# Patient Record
Sex: Female | Born: 1965 | Race: White | Hispanic: No | Marital: Single | Smoking: Former smoker
Health system: Southern US, Community
[De-identification: ages and names within clinical notes are randomized; demographics above are authoritative.]

## PROBLEM LIST (undated history)

## (undated) DIAGNOSIS — I10 Essential (primary) hypertension: Secondary | ICD-10-CM

## (undated) HISTORY — PX: TOE SURGERY: SHX1073

## (undated) HISTORY — PX: WISDOM TOOTH EXTRACTION: SHX21

---

## 2013-02-02 ENCOUNTER — Emergency Department (INDEPENDENT_AMBULATORY_CARE_PROVIDER_SITE_OTHER): Payer: BC Managed Care – HMO

## 2013-02-02 ENCOUNTER — Encounter: Payer: Self-pay | Admitting: Emergency Medicine

## 2013-02-02 ENCOUNTER — Emergency Department (INDEPENDENT_AMBULATORY_CARE_PROVIDER_SITE_OTHER)
Admission: EM | Admit: 2013-02-02 | Discharge: 2013-02-02 | Disposition: A | Discharge status: Home / Self Care | Payer: BC Managed Care – HMO | Source: Home / Self Care | Attending: Family Medicine | Admitting: Family Medicine

## 2013-02-02 DIAGNOSIS — M47817 Spondylosis without myelopathy or radiculopathy, lumbosacral region: Secondary | ICD-10-CM

## 2013-02-02 DIAGNOSIS — M545 Low back pain, unspecified: Secondary | ICD-10-CM

## 2013-02-02 MED ORDER — MELOXICAM 15 MG PO TABS: 15.0000 mg | ORAL_TABLET | Freq: Every day | ORAL | Status: DC

## 2013-02-02 MED ORDER — METAXALONE 800 MG PO TABS: 800.0000 mg | ORAL_TABLET | Freq: Three times a day (TID) | ORAL | Status: DC

## 2013-02-02 NOTE — ED Notes (Signed)
Tracy [name] complains of lower right side back pain for 1 day. She reports falling on and hitting her lower back on Wednesday. The pain is worse when sitting and is a 6/10. She states the pain is a aching pain that does at times radiate down her right leg. When she is standing she notices numbness in her left leg.

## 2013-02-02 NOTE — ED Provider Notes (Signed)
CSN: [id number]     Arrival date & time 02/02/13  1256 History   First MD Initiated Contact with Patient 02/02/13 1346     Chief Complaint  Patient presents with  . Back Pain    lower back pain for 1 day      HPI Comments: Patient reports that she slipped on ice steps four days ago, landing on her right buttock.  She did not have much discomfort initially, but yesterday she developed increased right lower back pain when sitting, and now has intermittent radiation into her right, with occasional brief decreased sensation over the anterior aspect of her right thigh.  Her pain decreases when she stands and supine in bed.  No bowel or bladder dysfunction.  No saddle numbness.  Patient is a 48 y.o. female presenting with back pain. The history is provided by the patient.  Back Pain Location:  Lumbar spine Quality:  Aching Pain severity:  Moderate Pain is:  Worse during the day Onset quality:  Gradual Duration:  1 day Timing:  Intermittent Progression:  Worsening Chronicity:  New Relieved by: standing. Worsened by:  Sitting Ineffective treatments:  Heating pad (naproxen and TENS unit) Associated symptoms: numbness and paresthesias   Associated symptoms: no abdominal pain, no abdominal swelling, no bladder incontinence, no bowel incontinence, no fever, no leg pain, no pelvic pain, no perianal numbness, no tingling and no weakness   Risk factors: obesity     History reviewed. No pertinent past medical history. Past Surgical History  Procedure Laterality Date  . Wisdom tooth extraction    . Toe surgery     Family History  Problem Relation Age of Onset  . Diabetes Mother   . Hyperlipidemia Mother   . Cancer Father     lung   History  Substance Use Topics  . Smoking status: Former Games developer  . Smokeless tobacco: Never Used  . Alcohol Use: No   OB History   Grav Para Term Preterm Abortions TAB SAB Ect Mult Living                 Review of Systems  Constitutional: Negative for  fever.  Gastrointestinal: Negative for abdominal pain and bowel incontinence.  Genitourinary: Negative for bladder incontinence and pelvic pain.  Musculoskeletal: Positive for back pain.  Neurological: Positive for numbness and paresthesias. Negative for tingling and weakness.  All other systems reviewed and are negative.    Allergies  Penicillins  Home Medications   Current Outpatient Rx  Name  Route  Sig  Dispense  Refill  . naproxen (NAPROSYN) 500 MG tablet   Oral   Take 500 mg by mouth 2 (two) times daily with a meal.         . meloxicam (MOBIC) 15 MG tablet   Oral   Take 1 tablet (15 mg total) by mouth daily. Take with food each morning   15 tablet   0   . metaxalone (SKELAXIN) 800 MG tablet   Oral   Take 1 tablet (800 mg total) by mouth 3 (three) times daily. May take one tab, one to three times daily   21 tablet   0    BP 143/81  Pulse 86  Temp(Src) 98.7 F (37.1 C) (Oral)  Ht 5\' 7"  (1.702 m)  Wt 283 lb (128.368 kg)  BMI 44.31 kg/m2  SpO2 96%  LMP 01/23/2013 Physical Exam  Nursing note and vitals reviewed. Constitutional: She is oriented to person, place, and time. She appears well-developed  and well-nourished. No distress.  Patient is obese (BMI 44.3)  HENT:  Head: Atraumatic.  Eyes: Conjunctivae are normal. Pupils are equal, round, and reactive to light.  Neck: Normal range of motion.  Cardiovascular: Normal heart sounds.   Pulmonary/Chest: Breath sounds normal.  Abdominal: There is no tenderness.  Musculoskeletal: Normal range of motion.       Lumbar back: She exhibits tenderness, pain and spasm. She exhibits no bony tenderness, no swelling, no edema, no deformity and normal pulse.       Back:  Back has decreased range of motion, although she can heel/toe walk and squat without difficulty.    Tenderness in the right sacral area.  Straight leg raising test is negative.  Sitting knee extension test is negative.  Strength and sensation in the lower  extremities is normal.  Patellar reflexes are present with augmentation.  Achilles reflexes are normal   Neurological: She is alert and oriented to person, place, and time.  Skin: Skin is warm and dry. No rash noted.    ED Course  Procedures  None  Imaging Review Dg Lumbar Spine Complete  02/02/2013   CLINICAL DATA:  Low back pain status post fall 4 days ago.  EXAM: LUMBAR SPINE - COMPLETE 4+ VIEW  COMPARISON:  None  FINDINGS: The lumbar vertebral bodies are preserved in height. There is mild disc space narrowing at L3-4. There is small anterior endplate spurs at L2, L3, L4, and L5. There is no pars defect. There is no spondylolisthesis. The pedicles and transverse processes are grossly normal. The observed portions of the sacrum are normal.  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 There is no evidence of acute acute compression fracture nor other acute bony abnormality.   Electronically Signed   By: David  [place]   On: 02/02/2013 14:45      MDM   1. Acute low back pain    Begin Mobic and Skelaxin. Apply ice pack two to three times daily.  Begin back range of motion and stretching exercises in about 3 days ([facility] information and instruction handout given)  Take Naproxen today; begin Mobic tomorrow morning. Followup with Dr. Rodney Langtonhomas Thekkekand[facility] ([facility]) if not improving about two weeks.     Lattie HawStephen A Maziah Keeling, MD 02/05/13 [phone]

## 2013-02-02 NOTE — Discharge Instructions (Signed)
Apply ice pack two to three times daily.  Begin back range of motion and stretching exercises in about 3 days. Take Naproxen today; begin Mobic tomorrow morning.  Back Pain, Adult Low back pain is very common. About 1 in 5 people have back pain.The cause of low back pain is rarely dangerous. The pain often gets better over time.About half of people with a sudden onset of back pain feel better in just 2 weeks. About 8 in 10 people feel better by 6 weeks.  CAUSES Some common causes of back pain include:  Strain of the muscles or ligaments supporting the spine.  Wear and tear (degeneration) of the spinal discs.  Arthritis.  Direct injury to the back. DIAGNOSIS Most of the time, the direct cause of low back pain is not known.However, back pain can be treated effectively even when the exact cause of the pain is unknown.Answering your caregiver's questions about your overall health and symptoms is one of the most accurate ways to make sure the cause of your pain is not dangerous. If your caregiver needs more information, he or she may order lab work or imaging tests (X-rays or MRIs).However, even if imaging tests show changes in your back, this usually does not require surgery. HOME CARE INSTRUCTIONS For many people, back pain returns.Since low back pain is rarely dangerous, it is often a condition that people can learn to [facility] their own.   Remain active. It is stressful on the back to sit or stand in one place. Do not sit, drive, or stand in one place for more than 30 minutes at a time. Take short walks on level surfaces as soon as pain allows.Try to increase the length of time you walk each day.  Do not stay in bed.Resting more than 1 or 2 days can delay your recovery.  Do not avoid exercise or work.Your body is made to move.It is not dangerous to be active, even though your back may hurt.Your back will likely heal faster if you return to being active before your pain is  gone.  Pay attention to your body when you bend and lift. Many people have less discomfortwhen lifting if they bend their knees, keep the load close to their bodies,and avoid twisting. Often, the most comfortable positions are those that put less stress on your recovering back.  Find a comfortable position to sleep. Use a firm mattress and lie on your side with your knees slightly bent. If you lie on your back, put a pillow under your knees.  Only take over-the-counter or prescription medicines as directed by your caregiver. Over-the-counter medicines to reduce pain and inflammation are often the most helpful.Your caregiver may prescribe muscle relaxant drugs.These medicines help dull your pain so you can more quickly return to your normal activities and healthy exercise.  Put ice on the injured area.  Put ice in a plastic bag.  Place a towel between your skin and the bag.  Leave the ice on for 15-20 minutes, 03-04 times a day for the first 2 to 3 days. After that, ice and heat may be alternated to reduce pain and spasms.  Ask your caregiver about trying back exercises and gentle massage. This may be of some benefit.  Avoid feeling anxious or stressed.Stress increases muscle tension and can worsen back pain.It is important to recognize when you are anxious or stressed and learn ways to manage it.Exercise is a great option. SEEK MEDICAL CARE IF:  You have pain that is not  relieved with rest or medicine.  You have pain that does not improve in 1 week.  You have new symptoms.  You are generally not feeling well. SEEK IMMEDIATE MEDICAL CARE IF:   You have pain that radiates from your back into your legs.  You develop new bowel or bladder control problems.  You have unusual weakness or numbness in your arms or legs.  You develop nausea or vomiting.  You develop abdominal pain.  You feel faint. Document Released: 01/02/2005 Document Revised: 07/04/2011 Document Reviewed:  05/23/2010 [facility] Patient Information 2014 [place], [place].

## 2013-02-06 ENCOUNTER — Telehealth: Payer: Self-pay | Admitting: Emergency Medicine

## 2014-06-27 ENCOUNTER — Encounter: Payer: Self-pay | Admitting: Emergency Medicine

## 2014-06-27 ENCOUNTER — Emergency Department (INDEPENDENT_AMBULATORY_CARE_PROVIDER_SITE_OTHER)
Admission: EM | Admit: 2014-06-27 | Discharge: 2014-06-27 | Disposition: A | Discharge status: Home / Self Care | Payer: Self-pay | Source: Home / Self Care | Attending: Family Medicine | Admitting: Family Medicine

## 2014-06-27 DIAGNOSIS — L089 Local infection of the skin and subcutaneous tissue, unspecified: Secondary | ICD-10-CM

## 2014-06-27 DIAGNOSIS — S20469A Insect bite (nonvenomous) of unspecified back wall of thorax, initial encounter: Secondary | ICD-10-CM

## 2014-06-27 DIAGNOSIS — W57XXXA Bitten or stung by nonvenomous insect and other nonvenomous arthropods, initial encounter: Principal | ICD-10-CM

## 2014-06-27 MED ORDER — MUPIROCIN 2 % EX OINT: 1.0000 "application " | TOPICAL_OINTMENT | Freq: Three times a day (TID) | CUTANEOUS | Status: DC

## 2014-06-27 NOTE — ED Provider Notes (Signed)
CSN: [id number]     Arrival date & time 06/27/14  1159 History   First MD Initiated Contact with Patient 06/27/14 1215     Chief Complaint  Patient presents with  . Insect Bite      HPI Comments: Patient discovered an embedded tick on her back 3.5 weeks ago (she believes that the tick had been embedded for about a week).  She presented to an ER where the tick was removed, and she was treated with doxycycline  BID for 2 weeks. She complains of persistent itching at the bite site and is concerned that the area may be infected.  She feels well otherwise.  No fevers, chills, and sweats.  No rash.  The history is provided by the patient.    History reviewed. No pertinent past medical history. Past Surgical History  Procedure Laterality Date  . Wisdom tooth extraction    . Toe surgery     Family History  Problem Relation Age of Onset  . Diabetes Mother   . Hyperlipidemia Mother   . Cancer Father     lung   History  Substance Use Topics  . Smoking status: Former Games developer  . Smokeless tobacco: Never Used  . Alcohol Use: No   OB History    No data available     Review of Systems  Constitutional: Negative for fever, chills, diaphoresis and fatigue.  HENT: Negative.   Eyes: Negative.   Respiratory: Negative.   Cardiovascular: Negative.   Gastrointestinal: Negative.   Genitourinary: Negative.   Musculoskeletal: Negative.   Skin: Positive for wound.  Neurological: Negative for headaches.    Allergies  Penicillins  Home Medications   Prior to Admission medications   Medication Sig Start Date End Date Taking? Authorizing Provider  meloxicam (MOBIC) 15 MG tablet Take 1 tablet (15 mg total) by mouth daily. Take with food each morning 02/02/13   Lattie Haw, MD  metaxalone (SKELAXIN) 800 MG tablet Take 1 tablet (800 mg total) by mouth 3 (three) times daily. May take one tab, one to three times daily 02/02/13   Lattie Haw, MD  mupirocin ointment (BACTROBAN) 2 % Apply  1 application topically 3 (three) times daily. 06/27/14   Lattie Haw, MD  naproxen (NAPROSYN) 500 MG tablet Take 500 mg by mouth 2 (two) times daily with a meal.    Historical Provider, MD   BP 134/83 mmHg  Pulse 79  Temp(Src) 98.7 F (37.1 C) (Oral)  Ht  (1.702 m)  Wt 266 lb 4 oz (120.77 kg)  BMI 41.69 kg/m2 Physical Exam  Constitutional: She is oriented to person, place, and time. She appears well-developed and well-nourished. No distress.  Patient is obese (BMI 41.7)  HENT:  Head: Normocephalic.  Nose: Nose normal.  Mouth/Throat: Oropharynx is clear and moist.  Eyes: Conjunctivae are normal. Pupils are equal, round, and reactive to light.  Neck: Neck supple.  Cardiovascular: Normal heart sounds.   Pulmonary/Chest: Breath sounds normal.    On patient's mid-back there is a 1cm diameter erythematous raised area of inflammation with small central 3mm diameter crater as noted on diagram.  No fluctuance.  No surrounding tenderness to palpation.    Abdominal: There is no tenderness.  Musculoskeletal: She exhibits no edema.  Lymphadenopathy:    She has no cervical adenopathy.  Neurological: She is alert and oriented to person, place, and time.  Skin: Skin is warm and dry.  Nursing note and vitals reviewed.   ED Course  Procedures none   Labs Reviewed  WOUND CULTURE      MDM   1. Infected tick bite of upper back excluding scapular region, unspecified laterality, initial encounter    Wound culture pending. Begin Bactroban ointment. Keep wound bandaged until healed, and change bandage daily. Followup with dermatologist if not resolved one week.    Lattie Haw, MD 07/04/14 1102

## 2014-06-27 NOTE — Discharge Instructions (Signed)
Keep wound bandaged until healed, and change bandage daily.

## 2014-06-27 NOTE — ED Notes (Signed)
Patient presents to the [facility] after having a tick removed 3.5 week ago. She took a round of antibiotics and is concerned that the bite could be infected.

## 2014-06-29 LAB — WOUND CULTURE
GRAM STAIN: NONE SEEN
GRAM STAIN: NONE SEEN
Gram Stain: NONE SEEN
ORGANISM ID, BACTERIA: NO GROWTH

## 2014-07-06 ENCOUNTER — Telehealth: Payer: Self-pay | Admitting: *Deleted

## 2015-01-22 ENCOUNTER — Ambulatory Visit (INDEPENDENT_AMBULATORY_CARE_PROVIDER_SITE_OTHER): Payer: BLUE CROSS/BLUE SHIELD

## 2015-01-22 ENCOUNTER — Ambulatory Visit (INDEPENDENT_AMBULATORY_CARE_PROVIDER_SITE_OTHER): Payer: BLUE CROSS/BLUE SHIELD | Admitting: Sports Medicine

## 2015-01-22 ENCOUNTER — Encounter: Payer: Self-pay | Admitting: Sports Medicine

## 2015-01-22 VITALS — BP 155/85 | HR 84 | Resp 16 | Wt 272.3 lb

## 2015-01-22 DIAGNOSIS — M25512 Pain in left shoulder: Secondary | ICD-10-CM

## 2015-01-22 DIAGNOSIS — M50323 Other cervical disc degeneration at C6-C7 level: Secondary | ICD-10-CM

## 2015-01-22 DIAGNOSIS — M5412 Radiculopathy, cervical region: Secondary | ICD-10-CM | POA: Insufficient documentation

## 2015-01-22 MED ORDER — MELOXICAM 15 MG PO TABS: ORAL_TABLET | ORAL | Status: DC

## 2015-01-22 NOTE — Progress Notes (Addendum)
Subjective:    I'm seeing this patient as a consultation for:  Tracy Minks, NP  CC: left shoulder pain  HPI: This is a pleasant 50 year old female with a several month history of pain that she localizes over the anterolateral and posterior lateral aspect of her left shoulder, with radiation from the neck down to the elbow. Pain is worse with overhead activities and reaching behind her back, no trauma. She does have some neck pain, and also has some pain over the right forearm laterally. Hasn't taken any medications, and has never been treated for this before. Pain is moderate, persistent.  On further questioning she does also have moderate difficulty  Falling asleep, mild anhedonia, mild depressed mood, mild poor energy and mild overeating. No suicidal or homicidal ideation.  Past medical history, Surgical history, Family history not pertinant except as noted below, Social history, Allergies, and medications have been entered into the medical record, reviewed, and no changes needed.   Review of Systems: No headache, visual changes, nausea, vomiting, diarrhea, constipation, dizziness, abdominal pain, skin rash, fevers, chills, night sweats, weight loss, swollen lymph nodes, body aches, joint swelling, muscle aches, chest pain, shortness of breath, mood changes, visual or auditory hallucinations.   Objective:   General: Well Developed, well nourished, and in no acute distress.  Neuro/Psych: Alert and oriented x3, extra-ocular muscles intact, able to move all 4 extremities, sensation grossly intact. Skin: Warm and dry, no rashes noted.  Respiratory: Not using accessory muscles, speaking in full sentences, trachea midline.  Cardiovascular: Pulses palpable, no extremity edema. Abdomen: Does not appear distended. Left Shoulder: Inspection reveals no abnormalities, atrophy or asymmetry. Palpation is normal with no tenderness over AC joint or bicipital groove. ROM is full in all  planes. Rotator cuff strength normal throughout. Positive Neer and Hawkin's tests, empty can. Speeds and Yergason's tests normal. No labral pathology noted with negative Obrien's, negative crank, negative clunk, and good stability. Normal scapular function observed. No painful arc and no drop arm sign. No apprehension sign  Procedure: Real-time Ultrasound Guided Injection of left subacromial bursa Device: GE Logiq E  Verbal informed consent obtained.  Time-out conducted.  Noted no overlying erythema, induration, or other signs of local infection.  Skin prepped in a sterile fashion.  Local anesthesia: Topical Ethyl chloride.  With sterile technique and under real time ultrasound guidance:  25-gauge needle advanced into the bursa, 1 mL kenalog 40, 3 mL lidocaine injected easily. Completed without difficulty  Pain immediately resolved suggesting accurate placement of the medication.  Advised to call if fevers/chills, erythema, induration, drainage, or persistent bleeding.  Images permanently stored and available for review in the ultrasound unit.  Impression: Technically successful ultrasound guided injection.  Procedure: Real-time Ultrasound Guided Injection of left glenohumeral joint Device: GE Logiq E  Verbal informed consent obtained.  Time-out conducted.  Noted no overlying erythema, induration, or other signs of local infection.  Skin prepped in a sterile fashion.  Local anesthesia: Topical Ethyl chloride.  With sterile technique and under real time ultrasound guidance:  Spinal needle advanced into the glenohumeral joint taking care to avoid the labrum, 1 mL kenalog 40, 3 mL lidocaine injected easily. Completed without difficulty  Pain immediately resolved suggesting accurate placement of the medication.  Advised to call if fevers/chills, erythema, induration, drainage, or persistent bleeding.  Images permanently stored and available for review in the ultrasound unit.   Impression: Technically successful ultrasound guided injection.  Impression and Recommendations:   This case required medical decision  making of moderate complexity.

## 2015-01-22 NOTE — Assessment & Plan Note (Signed)
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 She does have significant impingement type symptoms referable to the supraspinatus and subscapularis. Pain is waking her from sleep. Subacromial and glenohumeral injections under ultrasound guidance, x-rays of the neck and shoulder, as well as formal physical therapy for the neck and shoulder, return to see me in one.

## 2015-01-29 ENCOUNTER — Ambulatory Visit (INDEPENDENT_AMBULATORY_CARE_PROVIDER_SITE_OTHER): Payer: BLUE CROSS/BLUE SHIELD | Admitting: Rehabilitative and Restorative Service Providers"

## 2015-01-29 ENCOUNTER — Encounter: Payer: Self-pay | Admitting: Rehabilitative and Restorative Service Providers"

## 2015-01-29 DIAGNOSIS — M629 Disorder of muscle, unspecified: Secondary | ICD-10-CM

## 2015-01-29 DIAGNOSIS — M623 Immobility syndrome (paraplegic): Secondary | ICD-10-CM | POA: Diagnosis not present

## 2015-01-29 DIAGNOSIS — M25512 Pain in left shoulder: Secondary | ICD-10-CM

## 2015-01-29 DIAGNOSIS — Z7409 Other reduced mobility: Secondary | ICD-10-CM

## 2015-01-29 DIAGNOSIS — R293 Abnormal posture: Secondary | ICD-10-CM

## 2015-01-29 DIAGNOSIS — M2560 Stiffness of unspecified joint, not elsewhere classified: Secondary | ICD-10-CM

## 2015-01-29 DIAGNOSIS — M6289 Other specified disorders of muscle: Secondary | ICD-10-CM

## 2015-01-29 NOTE — Therapy (Signed)
[facility] [place] [address] [place], [place], [address] Phone: [phone]   Fax:  [phone]  Physical Therapy Evaluation  Patient Details  Name: Tracy [name] MRN: [id number] Date of Birth: 08-13-1965 Referring Provider: DR T   Encounter Date: 01/29/2015      PT End of Session - 01/29/15 1451    Visit Number 1   Number of Visits 12   Date for PT Re-Evaluation 03/12/15   PT Start Time 1401   PT Stop Time 1459   PT Time Calculation (min) 58 min   Activity Tolerance Patient tolerated treatment well      History reviewed. No pertinent past medical history.  Past Surgical History  Procedure Laterality Date  . Wisdom tooth extraction    . Toe surgery      There were no vitals filed for this visit.  Visit Diagnosis:  Pain in shoulder region, left - Plan: PT plan of care cert/re-cert  Abnormal posture - Plan: PT plan of care cert/re-cert  Muscular imbalance - Plan: PT plan of care cert/re-cert  Stiffness due to immobility - Plan: PT plan of care cert/re-cert      Subjective Assessment - 01/29/15 1405    Subjective Patient reports that she started having pain in the Lt shoulder, neck and arm ~ 3-4 months ago. Symptoms continued and she began having difficulty sleeping and using Lt UE. She was seen by MD last week and diagnosed with impingement. She received 2 injections last week which has helped some. She continues to have discomfort and pain which she feels is muscular.    Pertinent History problems with neck and shoulder pain in the past with muscular tightness effecting Lt arm ~ 15 years ago which resolved.    How long can you sit comfortably? no limit   How long can you stand comfortably? no limit   How long can you walk comfortably? no limit   Diagnostic tests xrays - showed impingement; bursitis DDD cervical spine   Patient Stated Goals get rid of pain and prevent the pain from coming back   Currently in  Pain? Yes   Pain Score 3    Pain Location Shoulder   Pain Orientation Left;Anterior;Upper   Pain Descriptors / Indicators Dull;Aching  sharp wit htwisting arm    Pain Radiating Towards up into the Lt neck area and down into the Lt bidep   Pain Onset More than a month ago   Pain Frequency Constant   Aggravating Factors  twisting arm; fastendin or unfastending bra; dressing; lying on Rt side pulling cover up at night; lifting above a certain point; difficulty sleeping    Pain Relieving Factors injection some; heat at times             [facility] PT Assessment - 01/29/15 0001    Assessment   Medical Diagnosis Lt shoudler pain    Referring Provider DR T    Onset Date/Surgical Date 10/01/14   Hand Dominance Left   Next MD Visit 02/19/15   Prior Therapy none   Precautions   Precautions None   Balance Screen   Has the patient fallen in the past 6 months No   Has the patient had a decrease in activity level because of a fear of falling?  No   Is the patient reluctant to leave their home because of a fear of falling?  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 up and down during the day - primarily at computer changed jobs 8/16 with a new desk   Leisure household chores; working out with a Psychologist, educational 3x/wk - aerobics/weights free [employer] and machines    Observation/Other Assessments   Focus on Therapeutic Outcomes (FOTO)  35% limitation    Sensation   Additional Comments WFL's per pt report    Posture/Postural Control   Posture Comments head forward; shoudlers rounded and elevated; head of the humerus anterior in orientation; increased thoracic kyphosis; scapulae abducted and rotated along the thoracic wall; head of the humer anterior in orientation    AROM   Right Shoulder Extension 55 Degrees   Right Shoulder Flexion 153 Degrees   Right Shoulder ABduction 157 Degrees    Right Shoulder Internal Rotation 35 Degrees   Right Shoulder External Rotation 90 Degrees   Left Shoulder Extension 52 Degrees   Left Shoulder Flexion 143 Degrees  pain   Left Shoulder ABduction 144 Degrees  pain   Left Shoulder Internal Rotation 30 Degrees  pain    Left Shoulder External Rotation 78 Degrees  pain    Cervical Flexion 62   Cervical Extension 46   Cervical - Right Side Bend 36  tight Rt > Lt   Cervical - Left Side Bend 35  tight   Cervical - Right Rotation 78   Cervical - Left Rotation 55   Strength   Overall Strength Comments 5/5 pain with resisted ER/IR Lt    Palpation   Palpation comment pain and tightness Lt pecs/anterior shoulder/upper trap/ant-lat-post cervical musculature                   [facility] Adult PT Treatment/Exercise - 01/29/15 0001    Therapeutic Activites    Therapeutic Activities --  myofacial ball release work    Neuro Re-ed    Neuro Re-ed Details  postural correction working to engage posterior shoulder girdle myusculature    Shoulder Exercises: Standing   Other Standing Exercises axial extension 10 sec x 5    Other Standing Exercises scap squeeze with noodle 10 sec x 10    Shoulder Exercises: Lawyer Limitations doorway 30 sec x 3 positions 2 reps each    Moist Heat Therapy   Number Minutes Moist Heat 15 Minutes   Moist Heat Location Cervical;Shoulder   Electrical Stimulation   Electrical Stimulation Location Lt posterior cervical; ant/post shd; biceps   Electrical Stimulation Action IFC   Electrical Stimulation Parameters to tolerance   Electrical Stimulation Goals Pain;Tone                PT Education - 01/29/15 1446    Education provided Yes   Education Details posture and alignment; myofacial ball release work; Leisure centre manager) Educated Patient   Methods Explanation;Demonstration;Tactile cues;Verbal cues;Handout   Comprehension Verbalized understanding;Returned demonstration;Verbal cues  required;Tactile cues required             PT Long Term Goals - 01/29/15 1501    PT LONG TERM GOAL #1   Title Improve posture and alignment with patient to demonstrate good alignment with scapulae in good position along the thoracic wall and shoulder over hips and knees 03/12/15   Time 6   Period Weeks   Status New   PT LONG TERM GOAL #2   Title Pain free resistive strength Lt shoulder 03/12/15   Time  6   Period Weeks   Status New   PT LONG TERM GOAL #3   Title Decrease pain Lt shouder allowing patient to dress and undress - including bra - without pain 03/12/15   Time 6   Period Weeks   Status New   PT LONG TERM GOAL #4   Title I in HEP 03/12/15   Time 6   Period Weeks   Status New   PT LONG TERM GOAL #5   Title Improve FOTO to </= 28% limitation 03/12/15   Time 6   Period Weeks   Status New               Plan - 01/29/15 1451    Clinical Impression Statement Patient presents with Lt shoulder and upper quarter dysfunction including pain; limited cervical and Lt shoulder ROM/mobility; pain with resistive testing Lt shoulder; pain with palpation; abnormal posture and alignment; muscular imbalance. She will benefit from PT to address problems identified.    Pt will benefit from skilled therapeutic intervention in order to improve on the following deficits Postural dysfunction;Improper body mechanics;Decreased range of motion;Decreased mobility;Increased fascial restricitons;Decreased endurance;Decreased activity tolerance;Pain   Rehab Potential Good   PT Frequency 2x / week   PT Duration 6 weeks   PT Treatment/Interventions Patient/family education;ADLs/Self Care Home Management;Manual techniques;Dry needling;Therapeutic exercise;Therapeutic activities;Neuromuscular re-education;Ultrasound;Cryotherapy;Electrical Stimulation;Moist Heat   PT Next Visit Plan continue with postural correction; stretching; add manual work for Lt upper quarter; progress with posterior shoulder  girdle strengthening and improving muscular balance   PT Home Exercise Plan postural correction; HEP; myofacial ball release    Consulted and Agree with Plan of Care Patient         Problem List Patient Active Problem List   Diagnosis Date Noted  . Left shoulder pain 01/22/2015    Glendola Friedhoff Rober MinionP Nyomie Ehrlich PT, MPH  01/29/2015, 3:17 PM  [facility] [address] [place] [place], [place], [address] Phone: [phone]   Fax:  [phone]  Name: Tracy [name] MRN: [id number] Date of Birth: 11-09-1965

## 2015-01-29 NOTE — Patient Instructions (Signed)
Axial Extension (Chin Tuck)    Pull chin in and lengthen back of neck. Hold _10___ seconds while counting out loud. Repeat __5-10__ times. Do several  sessions per day.   Shoulder Blade Squeeze    Rotate shoulders back, then squeeze shoulder blades down and back Hold 10 sec Repeat __10__ times. Do _several___ sessions per day.   Scapula Adduction With Pectoralis Stretch: Low - Standing   Shoulders at 45 hands even with shoulders, keeping weight through legs, shift weight forward until you feel pull or stretch through the front of your chest. Hold _30__ seconds. Do _3__ times, _2-4__ times per day.   Scapula Adduction With Pectoralis Stretch: Mid-Range - Standing   Shoulders at 90 elbows even with shoulders, keeping weight through legs, shift weight forward until you feel pull or strength through the front of your chest. Hold __30_ seconds. Do _3__ times, __2-4_ times per day.   Scapula Adduction With Pectoralis Stretch: High - Standing   Shoulders at 120 hands up high on the doorway, keeping weight on feet, shift weight forward until you feel pull or stretch through the front of your chest. Hold _30__ seconds. Do _3__ times, _2-3__ times per day.  Massage using 4 inch rubber ball  TENS unit for pain management

## 2015-02-02 ENCOUNTER — Ambulatory Visit (INDEPENDENT_AMBULATORY_CARE_PROVIDER_SITE_OTHER): Payer: BLUE CROSS/BLUE SHIELD | Admitting: Physical Therapy

## 2015-02-02 DIAGNOSIS — M629 Disorder of muscle, unspecified: Secondary | ICD-10-CM | POA: Diagnosis not present

## 2015-02-02 DIAGNOSIS — M25512 Pain in left shoulder: Secondary | ICD-10-CM | POA: Diagnosis not present

## 2015-02-02 DIAGNOSIS — M623 Immobility syndrome (paraplegic): Secondary | ICD-10-CM

## 2015-02-02 DIAGNOSIS — R293 Abnormal posture: Secondary | ICD-10-CM | POA: Diagnosis not present

## 2015-02-02 DIAGNOSIS — Z7409 Other reduced mobility: Secondary | ICD-10-CM

## 2015-02-02 DIAGNOSIS — M2560 Stiffness of unspecified joint, not elsewhere classified: Secondary | ICD-10-CM

## 2015-02-02 DIAGNOSIS — M6289 Other specified disorders of muscle: Secondary | ICD-10-CM

## 2015-02-02 NOTE — Therapy (Signed)
[facility] [place] [address] [place] [address] [place], [place], [address] Phone: [phone]   Fax:  [phone]  Physical Therapy Treatment  Patient Details  Name: Tracy [name] MRN: [id number] Date of Birth: January 09, 1966 Referring Provider: Dr. Briant Sites  Encounter Date: 02/02/2015      PT End of Session - 02/02/15 0804    Visit Number 2   Number of Visits 12   Date for PT Re-Evaluation 03/12/15   PT Start Time 0803   PT Stop Time 0856   PT Time Calculation (min) 53 min   Activity Tolerance Patient tolerated treatment well;No increased pain      No past medical history on file.  Past Surgical History  Procedure Laterality Date  . Wisdom tooth extraction    . Toe surgery      There were no vitals filed for this visit.  Visit Diagnosis:  Pain in shoulder region, left  Abnormal posture  Muscular imbalance  Stiffness due to immobility      Subjective Assessment - 02/02/15 0805    Subjective Pt reports she has had no new changes since initial visit. Has been performing HEP, 1x.     Currently in Pain? Yes   Pain Score 1    Pain Location Shoulder   Pain Orientation Left;Anterior;Upper   Pain Descriptors / Indicators Dull;Aching   Aggravating Factors  donning bra, sleeping on it, any "twisting arm" (IR)   Pain Relieving Factors ??            [facility] PT Assessment - 02/02/15 0001    Assessment   Medical Diagnosis Lt shoulder pain    Referring Provider Dr. Briant Sites   Onset Date/Surgical Date 10/01/14   Hand Dominance Left   Next MD Visit 02/19/15   Prior Therapy none          [facility] Adult PT Treatment/Exercise - 02/02/15 0001    Self-Care   Self-Care Other Self-Care Comments   Other Self-Care Comments  Pt instructed in self MFR to anterior chest; pt returned demo.    Exercises   Exercises Neck;Shoulder   Neck Exercises: Machines for Strengthening   UBE (Upper Arm Bike) L1: 1.5 min forward, 1.5' backward    Neck Exercises: Theraband   Scapula Retraction 10 reps  5 sec    Neck Exercises: Seated   Neck Retraction 10 reps;3 secs   Shoulder Exercises: Pulleys   Flexion 2 minutes   ABduction 2 minutes   Shoulder Exercises: Stretch   Corner Stretch Limitations doorway 30 sec x 3 positions 2 reps each; repeated unilateral with Lt.     Table Stretch - Flexion 5 reps;10 seconds   Table Stretch -Flexion Limitations demo and VC for improved form    Moist Heat Therapy   Number Minutes Moist Heat 15 Minutes   Moist Heat Location Cervical;Shoulder  Lt   Electrical Stimulation   Electrical Stimulation Location Lt shoulder / upper trap    Electrical Stimulation Action IFC   Electrical Stimulation Parameters  to tolerance, 15 min    Electrical Stimulation Goals Pain   Neck Exercises: Stretches   Upper Trapezius Stretch 2 reps;20 seconds           PT Education - 02/02/15 0844    Education provided Yes   Education Details HEP - added table flexion stretch   Person(s) Educated Patient   Methods Handout   Comprehension Verbalized understanding;Returned demonstration             PT Long  Term Goals - 02/02/15 0846    PT LONG TERM GOAL #1   Title Improve posture and alignment with patient to demonstrate good alignment with scapulae in good position along the thoracic wall and shoulder over hips and knees 03/12/15   Time 6   Period Weeks   Status On-going   PT LONG TERM GOAL #2   Title Pain free resistive strength Lt shoulder 03/12/15   Time 6   Period Weeks   Status On-going   PT LONG TERM GOAL #3   Title Decrease pain Lt shouder allowing patient to dress and undress - including bra - without pain 03/12/15   Time 6   Period Weeks   Status On-going   PT LONG TERM GOAL #4   Title I in HEP 03/12/15   Time 6   Period Weeks   Status On-going   PT LONG TERM GOAL #5   Title Improve FOTO to </= 28% limitation 03/12/15   Time 6   Period Weeks   Status On-going               Plan  - 02/02/15 0845    Clinical Impression Statement Pt required some demonstration and tactile cues for improved form of exercises.  Pt tolerated all exercises without increase in pain.  Pt reported decrease in discomfort with use of MHP and estim at end of session.  Progressing towards goals.    Pt will benefit from skilled therapeutic intervention in order to improve on the following deficits Postural dysfunction;Improper body mechanics;Decreased range of motion;Decreased mobility;Increased fascial restricitons;Decreased endurance;Decreased activity tolerance;Pain   Rehab Potential Good   PT Frequency 2x / week   PT Duration 6 weeks   PT Treatment/Interventions Patient/family education;ADLs/Self Care Home Management;Manual techniques;Dry needling;Therapeutic exercise;Therapeutic activities;Neuromuscular re-education;Ultrasound;Cryotherapy;Electrical Stimulation;Moist Heat   PT Next Visit Plan continue with postural correction; stretching; add manual work for [employer] upper quarter.    Consulted and Agree with Plan of Care Patient        Problem List Patient Active Problem List   Diagnosis Date Noted  . Left shoulder pain 01/22/2015   Mayer Camel, PTA 02/02/2015 10:11 AM  [facility] [address] [place] [address] [place], [place], [address] Phone: [phone]   Fax:  [phone]  Name: Tracy [name] MRN: [id number] Date of Birth: Apr 06, 1965

## 2015-02-02 NOTE — Patient Instructions (Signed)
Flexion (Passive)    Sitting upright, slide forearm forward along table, bending from the waist until a stretch is felt.  (Use trunk to move arm, keep arm relaxed on table) Hold __10__ seconds. Repeat _5-10___ times. Do _1-2___ sessions per day.  [facility] [address] [place], [place] [address]  [phone] (office) [phone] (fax)

## 2015-02-04 ENCOUNTER — Ambulatory Visit (INDEPENDENT_AMBULATORY_CARE_PROVIDER_SITE_OTHER): Payer: BLUE CROSS/BLUE SHIELD | Admitting: Physical Therapy

## 2015-02-04 DIAGNOSIS — M623 Immobility syndrome (paraplegic): Secondary | ICD-10-CM | POA: Diagnosis not present

## 2015-02-04 DIAGNOSIS — M2560 Stiffness of unspecified joint, not elsewhere classified: Secondary | ICD-10-CM

## 2015-02-04 DIAGNOSIS — M629 Disorder of muscle, unspecified: Secondary | ICD-10-CM

## 2015-02-04 DIAGNOSIS — R293 Abnormal posture: Secondary | ICD-10-CM

## 2015-02-04 DIAGNOSIS — M6289 Other specified disorders of muscle: Secondary | ICD-10-CM

## 2015-02-04 DIAGNOSIS — M25512 Pain in left shoulder: Secondary | ICD-10-CM | POA: Diagnosis not present

## 2015-02-04 DIAGNOSIS — Z7409 Other reduced mobility: Secondary | ICD-10-CM

## 2015-02-04 NOTE — Patient Instructions (Signed)
Over Head Pull: Narrow Grip     [place] [phone]   On back, knees bent, feet flat, band across thighs, elbows straight but relaxed. Pull hands apart (start). Keeping elbows straight, bring arms up and over head, hands toward floor. Keep pull steady on band. Hold momentarily. Return slowly, keeping pull steady, back to start. Repeat _10__ times. 1-2 sets. Band color __yellow or red____   Side Pull: Double Arm   On back, knees bent, feet flat. Arms perpendicular to body, shoulder level, elbows straight but relaxed. Pull arms out to sides, elbows straight. Resistance band comes across collarbones, hands toward floor. Hold momentarily. Slowly return to starting position. Repeat _10__ times. 1-2 sets.  Band color _red____   Elisabeth Cara   On back, knees bent, feet flat, left hand on left hip, right hand above left. Pull right arm DIAGONALLY (hip to shoulder) across chest. Bring right arm along head toward floor. Hold momentarily. Slowly return to starting position.  (thumb up like hitchhiker) Repeat __10_ times. 1-2 sets Do with left arm. Band color __yellow____   Shoulder Rotation: Double Arm   On back, knees bent, feet flat, elbows tucked at sides, bent 90, hands palms up. Pull hands apart and down toward floor, keeping elbows near sides. Hold momentarily. Slowly return to starting position. Repeat _10__ times. 1-2 sets. Band color __yellow____

## 2015-02-04 NOTE — Therapy (Signed)
[facility] [place] [address] [place], [place], [address] Phone: [phone]   Fax:  [phone]  Physical Therapy Treatment  Patient Details  Name: Tracy [name] MRN: [id number] Date of Birth: 1965-12-19 Referring Provider: Dr. Briant Sites  Encounter Date: 02/04/2015      PT End of Session - 02/04/15 1535    Visit Number 3   Number of Visits 12   Date for PT Re-Evaluation 03/12/15   PT Start Time 1533   PT Stop Time 1627   PT Time Calculation (min) 54 min   Activity Tolerance Patient tolerated treatment well      No past medical history on file.  Past Surgical History  Procedure Laterality Date  . Wisdom tooth extraction    . Toe surgery      There were no vitals filed for this visit.  Visit Diagnosis:  Pain in shoulder region, left  Abnormal posture  Muscular imbalance  Stiffness due to immobility      Subjective Assessment - 02/04/15 1536    Subjective Pt reports her Lt shoulder is a little more sore; reaching to front is painful.  Trouble sleeping last night   Currently in Pain? Yes   Pain Score 3    Pain Location Shoulder   Pain Orientation Left;Anterior   Pain Descriptors / Indicators Dull;Aching            [facility] PT Assessment - 02/04/15 0001    Assessment   Medical Diagnosis Lt shoulder pain    Referring Provider Dr. Briant Sites   Onset Date/Surgical Date 10/01/14   Hand Dominance Left   Next MD Visit 02/19/15   Prior Therapy none   AROM   Left Shoulder Flexion 153 Degrees  mild pain   Left Shoulder ABduction 153 Degrees   Left Shoulder Internal Rotation 68 Degrees   Left Shoulder External Rotation 90 Degrees  abd to 90 deg, pain at end range     Cervical - Right Side Bend 46   Cervical - Left Side Bend 38               [facility] Adult PT Treatment/Exercise - 02/04/15 0001    Shoulder Exercises: Supine   Horizontal ABduction Strengthening;Both;15 reps   Theraband Level  (Shoulder Horizontal ABduction) Level 2 (Red)   External Rotation Strengthening;Both;10 reps;Theraband   Theraband Level (Shoulder External Rotation) Level 1 (Yellow)   Flexion Both;10 reps  bilat overhead pull, to tolerance   Theraband Level (Shoulder Flexion) Level 1 (Yellow)   Other Supine Exercises Sash with yellow band x 10 reps LUE, to tolerance    Shoulder Exercises: Pulleys   Flexion 2 minutes   ABduction 2 minutes   Shoulder Exercises: Stretch   Other Shoulder Stretches IR stretch with strap x 5 reps , shoulder extension stretch with band behind back x 30 sec x 3 reps   Other Shoulder Stretches doorway mid level stretch x 30 x 3 reps    Moist Heat Therapy   Number Minutes Moist Heat 15 Minutes   Moist Heat Location Shoulder   Electrical Stimulation   Electrical Stimulation Location Lt shoulder   Electrical Stimulation Action IFC   Electrical Stimulation Parameters to tolerance    Electrical Stimulation Goals Pain   Neck Exercises: Stretches   Upper Trapezius Stretch 2 reps;20 seconds  towel as anchor                PT Education - 02/04/15 1621    Education provided  Yes   Education Details HEP   Person(s) Educated Patient   Methods Explanation;Handout;Demonstration   Comprehension Verbalized understanding;Returned demonstration             PT Long Term Goals - 02/02/15 0846    PT LONG TERM GOAL #1   Title Improve posture and alignment with patient to demonstrate good alignment with scapulae in good position along the thoracic wall and shoulder over hips and knees 03/12/15   Time 6   Period Weeks   Status On-going   PT LONG TERM GOAL #2   Title Pain free resistive strength Lt shoulder 03/12/15   Time 6   Period Weeks   Status On-going   PT LONG TERM GOAL #3   Title Decrease pain Lt shouder allowing patient to dress and undress - including bra - without pain 03/12/15   Time 6   Period Weeks   Status On-going   PT LONG TERM GOAL #4   Title I in HEP  03/12/15   Time 6   Period Weeks   Status On-going   PT LONG TERM GOAL #5   Title Improve FOTO to </= 28% limitation 03/12/15   Time 6   Period Weeks   Status On-going               Plan - 02/04/15 1617    Clinical Impression Statement Pt demonstrated improved Lt shoulder ROM, continues with some mild pain at end range. Pt tolerted new exercises with minimal increase in pain.  Pain was decreased at end of session with use of MHP and estim.  Progressing towards goals.    Pt will benefit from skilled therapeutic intervention in order to improve on the following deficits Postural dysfunction;Improper body mechanics;Decreased range of motion;Decreased mobility;Increased fascial restricitons;Decreased endurance;Decreased activity tolerance;Pain   Rehab Potential Good   PT Frequency 2x / week   PT Duration 6 weeks   PT Treatment/Interventions Patient/family education;ADLs/Self Care Home Management;Manual techniques;Dry needling;Therapeutic exercise;Therapeutic activities;Neuromuscular re-education;Ultrasound;Cryotherapy;Electrical Stimulation;Moist Heat   Consulted and Agree with Plan of Care Patient        Problem List Patient Active Problem List   Diagnosis Date Noted  . Left shoulder pain 01/22/2015   Mayer Camel, PTA 02/04/2015 4:33 PM  [facility] [place] [address] [place] [address] [place], [place], [address] Phone: [phone]   Fax:  [phone]  Name: Tracy [name] MRN: [id number] Date of Birth: 1965-06-08

## 2015-02-10 ENCOUNTER — Encounter: Payer: Self-pay | Admitting: Rehabilitative and Restorative Service Providers"

## 2015-02-10 ENCOUNTER — Ambulatory Visit (INDEPENDENT_AMBULATORY_CARE_PROVIDER_SITE_OTHER): Payer: BLUE CROSS/BLUE SHIELD | Admitting: Rehabilitative and Restorative Service Providers"

## 2015-02-10 DIAGNOSIS — Z7409 Other reduced mobility: Secondary | ICD-10-CM

## 2015-02-10 DIAGNOSIS — M25512 Pain in left shoulder: Secondary | ICD-10-CM

## 2015-02-10 DIAGNOSIS — R293 Abnormal posture: Secondary | ICD-10-CM | POA: Diagnosis not present

## 2015-02-10 DIAGNOSIS — M629 Disorder of muscle, unspecified: Secondary | ICD-10-CM

## 2015-02-10 DIAGNOSIS — M623 Immobility syndrome (paraplegic): Secondary | ICD-10-CM | POA: Diagnosis not present

## 2015-02-10 DIAGNOSIS — M2560 Stiffness of unspecified joint, not elsewhere classified: Secondary | ICD-10-CM

## 2015-02-10 DIAGNOSIS — M6289 Other specified disorders of muscle: Secondary | ICD-10-CM

## 2015-02-10 NOTE — Patient Instructions (Signed)
Biceps, Standing    You won't have a helper. You can use the counter this is just the position for the stretch Place one arm behind back with hand on counter surface; palms facing down. Step forward keeping weight on legs and back up straight  Hold _20-30__ seconds. Repeat _3__ times per session. Do _2-3__ sessions per day.   Trigger Point Dry Needling  . What is Trigger Point Dry Needling (DN)? o DN is a physical therapy technique used to treat muscle pain and dysfunction. Specifically, DN helps deactivate muscle trigger points (muscle knots).  o A thin filiform needle is used to penetrate the skin and stimulate the underlying trigger point. The goal is for a local twitch response (LTR) to occur and for the trigger point to relax. No medication of any kind is injected during the procedure.   . What Does Trigger Point Dry Needling Feel Like?  o The procedure feels different for each individual patient. Some patients report that they do not actually feel the needle enter the skin and overall the process is not painful. Very mild bleeding may occur. However, many patients feel a deep cramping in the muscle in which the needle was inserted. This is the local twitch response.   Marland Kitchen How Will I feel after the treatment? o Soreness is normal, and the onset of soreness may not occur for a few hours. Typically this soreness does not last longer than two days.  o Bruising is uncommon, however; ice can be used to decrease any possible bruising.  o In rare cases feeling tired or nauseous after the treatment is normal. In addition, your symptoms may get worse before they get better, this period will typically not last longer than 24 hours.   . What Can I do After My Treatment? o Increase your hydration by drinking more water for the next 24 hours. o You may place ice or heat on the areas treated that have become sore, however, do not use heat on inflamed or bruised areas. Heat often brings more relief post  needling. o You can continue your regular activities, but vigorous activity is not recommended initially after the treatment for 24 hours. o DN is best combined with other physical therapy such as strengthening, stretching, and other therapies.

## 2015-02-10 NOTE — Therapy (Signed)
[facility] [place] [address] [place], [place], [address] Phone: [phone]   Fax:  [phone]  Physical Therapy Treatment  Patient Details  Name: Tracy [name] MRN: [id number] Date of Birth: 02-07-1965 Referring Provider: Benjamin Stain  Encounter Date: 02/10/2015      PT End of Session - 02/10/15 1519    Visit Number 4   Number of Visits 12   Date for PT Re-Evaluation 03/12/15   PT Start Time 1519   PT Stop Time 1615   PT Time Calculation (min) 56 min   Activity Tolerance Patient limited by pain;Patient tolerated treatment well      History reviewed. No pertinent past medical history.  Past Surgical History  Procedure Laterality Date  . Wisdom tooth extraction    . Toe surgery      There were no vitals filed for this visit.  Visit Diagnosis:  Pain in shoulder region, left  Abnormal posture  Muscular imbalance  Stiffness due to immobility      Subjective Assessment - 02/10/15 1520    Subjective Patient reports that she did some lifting over the weekend and the shoulder feels a lottle worse today. lifting bins into the attic. Has had Rt sided headache for the past day - not sure if that is related.    Currently in Pain? Yes   Pain Score 3    Pain Location Shoulder   Pain Orientation Left;Anterior   Pain Descriptors / Indicators Dull;Aching   Pain Radiating Towards up into the neck and down into the Rt biceps    Pain Onset More than a month ago   Pain Frequency Constant   Aggravating Factors  donning bra; sleeping on the Rt side; moving Rt arm iin certain ways    Pain Relieving Factors nothing             [facility] PT Assessment - 02/10/15 0001    Assessment   Medical Diagnosis Lt shoulder pain    Referring Provider Thekkekandam   Onset Date/Surgical Date 10/01/14   Hand Dominance Left   Next MD Visit 02/19/15   Prior Therapy none   AROM   Left Shoulder Flexion 155 Degrees  pain    Left Shoulder  ABduction 153 Degrees  pain   Left Shoulder External Rotation 91 Degrees  shd at 90 deg abd                     [facility] Adult PT Treatment/Exercise - 02/10/15 0001    Neck Exercises: Machines for Strengthening   UBE (Upper Arm Bike) L3 2.5 min fwd/2 min back    Neck Exercises: Supine   Neck Retraction 10 reps;10 secs   Shoulder Exercises: Supine   Horizontal ABduction Strengthening;Both;15 reps   Theraband Level (Shoulder Horizontal ABduction) Level 2 (Red)   External Rotation Strengthening;Both;10 reps;Theraband   Theraband Level (Shoulder External Rotation) Level 1 (Yellow)   Flexion Both;10 reps  bilat overhead pull, to tolerance   Theraband Level (Shoulder Flexion) Level 1 (Yellow)   Shoulder Exercises: Stretch   Corner Stretch Limitations doorway 3 positions 30 sec x 2 each; unilateral with arm extended at side Lt only 30 sec x 2    Other Shoulder Stretches IR stretch with strap x 5 reps    Other Shoulder Stretches biceps stretch 20-30 sec x 3    Moist Heat Therapy   Number Minutes Moist Heat 15 Minutes   Moist Heat Location Shoulder;Cervical   Electrical Stimulation  Electrical Stimulation Location Lt shoulder   Electrical Stimulation Action IFC   Electrical Stimulation Parameters to tolerance    Electrical Stimulation Goals Pain;Tone   Manual Therapy   Manual Therapy --  patient supine    Joint Mobilization cervical PA mobs; Lt to Rt lat glides Grade 11    Soft tissue mobilization moderate trigger point work through the Rt paracervical mm ~ C5; Lt ant/lat/post cervical mm; upper trap; leveator; pecs; teres/lats(very tender); deltoid    Scapular Mobilization lateral glide Lt    Manual Traction cervical traction                 PT Education - 02/10/15 1611    Education provided Yes   Education Details biceps stretch; encouraged consistent work on posture and stretching; HEP; info about TDN   Person(s) Educated Patient   Methods  Explanation;Demonstration;Tactile cues;Verbal cues;Handout   Comprehension Verbalized understanding;Returned demonstration;Verbal cues required;Tactile cues required             PT Long Term Goals - 02/02/15 0846    PT LONG TERM GOAL #1   Title Improve posture and alignment with patient to demonstrate good alignment with scapulae in good position along the thoracic wall and shoulder over hips and knees 03/12/15   Time 6   Period Weeks   Status On-going   PT LONG TERM GOAL #2   Title Pain free resistive strength Lt shoulder 03/12/15   Time 6   Period Weeks   Status On-going   PT LONG TERM GOAL #3   Title Decrease pain Lt shouder allowing patient to dress and undress - including bra - without pain 03/12/15   Time 6   Period Weeks   Status On-going   PT LONG TERM GOAL #4   Title I in HEP 03/12/15   Time 6   Period Weeks   Status On-going   PT LONG TERM GOAL #5   Title Improve FOTO to </= 28% limitation 03/12/15   Time 6   Period Weeks   Status On-going               Plan - 02/10/15 1612    Clinical Impression Statement Improved shoulder ROM and can demo improved posture and alignment with VC. Continues to have significant pain and tenderness at TP' s Lt shoulder girdle (and Rt cervical area today). Pt may benefit from TDN - which was discussed today and she is agreable to try  the dry needling. We will continue with myofacial work and postural correction.    Pt will benefit from skilled therapeutic intervention in order to improve on the following deficits Postural dysfunction;Improper body mechanics;Decreased range of motion;Decreased mobility;Increased fascial restricitons;Decreased endurance;Decreased activity tolerance;Pain   Rehab Potential Good   PT Frequency 2x / week   PT Duration 6 weeks   PT Treatment/Interventions Patient/family education;ADLs/Self Care Home Management;Manual techniques;Dry needling;Therapeutic exercise;Therapeutic activities;Neuromuscular  re-education;Ultrasound;Cryotherapy;Electrical Stimulation;Moist Heat   PT Next Visit Plan continue with postural correction; stretching; add manual work for [employer] upper quarter. Trial of TDN as schedule allows    PT Home Exercise Plan postural correction; HEP; myofacial ball release    Consulted and Agree with Plan of Care Patient        Problem List Patient Active Problem List   Diagnosis Date Noted  . Left shoulder pain 01/22/2015    Lucilia Yanni Rober Minion PT, MPH  02/10/2015, 4:16 PM  [facility] [address] [place], [place], [address]  Phone: [phone]   Fax:  [phone]  Name: Gilberte Gorley MRN: [id number] Date of Birth: 16-Mar-1965

## 2015-02-12 ENCOUNTER — Encounter: Payer: Self-pay | Admitting: Rehabilitative and Restorative Service Providers"

## 2015-02-12 ENCOUNTER — Ambulatory Visit (INDEPENDENT_AMBULATORY_CARE_PROVIDER_SITE_OTHER): Payer: BLUE CROSS/BLUE SHIELD | Admitting: Rehabilitative and Restorative Service Providers"

## 2015-02-12 DIAGNOSIS — M25512 Pain in left shoulder: Secondary | ICD-10-CM | POA: Diagnosis not present

## 2015-02-12 DIAGNOSIS — M629 Disorder of muscle, unspecified: Secondary | ICD-10-CM

## 2015-02-12 DIAGNOSIS — R293 Abnormal posture: Secondary | ICD-10-CM | POA: Diagnosis not present

## 2015-02-12 DIAGNOSIS — M623 Immobility syndrome (paraplegic): Secondary | ICD-10-CM | POA: Diagnosis not present

## 2015-02-12 DIAGNOSIS — M6289 Other specified disorders of muscle: Secondary | ICD-10-CM

## 2015-02-12 DIAGNOSIS — M2560 Stiffness of unspecified joint, not elsewhere classified: Secondary | ICD-10-CM

## 2015-02-12 DIAGNOSIS — Z7409 Other reduced mobility: Secondary | ICD-10-CM

## 2015-02-12 NOTE — Therapy (Signed)
[facility] [place] [address] [place], [place], [address] Phone: [phone]   Fax:  [phone]  Physical Therapy Treatment  Patient Details  Name: Tracy [name] MRN: [id number] Date of Birth: [birth year] Referring Provider: Benjamin Stain  Encounter Date: 02/12/2015      PT End of Session - 02/12/15 1621    Visit Number 5   Number of Visits 12   Date for PT Re-Evaluation 03/12/15   PT Start Time 1545   PT Stop Time 1629   PT Time Calculation (min) 44 min   Activity Tolerance Patient tolerated treatment well  continued pain but improved mvt      History reviewed. No pertinent past medical history.  Past Surgical History  Procedure Laterality Date  . Wisdom tooth extraction    . Toe surgery      There were no vitals filed for this visit.  Visit Diagnosis:  Pain in shoulder region, left  Abnormal posture  Muscular imbalance  Stiffness due to immobility      Subjective Assessment - 02/12/15 1619    Subjective Pt repors that she feels much better following last treatment. Responded well to manual work and is willing to try TDN. Manual work helped "a lot".    Currently in Pain? Yes   Pain Score 2   1 1/2 to 2   Pain Location Shoulder   Pain Orientation Left;Anterior   Pain Descriptors / Indicators Aching;Dull   Pain Onset More than a month ago   Pain Frequency Constant                         [facility] Adult PT Treatment/Exercise - 02/12/15 0001    Neck Exercises: Machines for Strengthening   UBE (Upper Arm Bike) L3 3 min 1/2 fwd/1/2 back    Neck Exercises: Seated   Neck Retraction 10 reps;10 secs   Neck Exercises: Supine   Neck Retraction 10 reps;10 secs   Shoulder Exercises: Supine   Horizontal ABduction Strengthening;Both;15 reps   Theraband Level (Shoulder Horizontal ABduction) Level 2 (Red)   External Rotation Strengthening;Both;10 reps;Theraband   Theraband Level (Shoulder External  Rotation) Level 1 (Yellow)   Flexion Both;10 reps  bilat overhead pull, to tolerance   Theraband Level (Shoulder Flexion) Level 1 (Yellow)   Shoulder Exercises: Stretch   Corner Stretch Limitations doorway 3 positions 30 sec x 2 each; unilateral with arm extended at side Lt only 30 sec x 2    Other Shoulder Stretches IR stretch with strap x 5 reps    Other Shoulder Stretches biceps stretch 20-30 sec x 3    Moist Heat Therapy   Number Minutes Moist Heat 15 Minutes   Moist Heat Location Shoulder;Cervical   Electrical Stimulation   Electrical Stimulation Location Lt shoulder   Electrical Stimulation Action IFC   Electrical Stimulation Parameters to tolerance   Electrical Stimulation Goals Pain;Tone   Manual Therapy   Manual Therapy --  patient supine    Joint Mobilization cervical PA mobs; Lt to Rt lat glides Grade 11    Soft tissue mobilization moderate pressure with trigger point work through the Rt paracervical mm ~ C5; Lt ant/lat/post cervical mm; upper trap; leveator; pecs; teres/lats(very tender); deltoid    Scapular Mobilization lateral glide Lt    Passive ROM cervical stretch into flexion forward and w/ slight rotation   Manual Traction cervical traction  PT Education - 02/12/15 1618    Education provided Yes   Education Details consistent HEP; avoid overhead lifting    Person(s) Educated Patient   Methods Explanation   Comprehension Verbalized understanding             PT Long Term Goals - 02/02/15 0846    PT LONG TERM GOAL #1   Title Improve posture and alignment with patient to demonstrate good alignment with scapulae in good position along the thoracic wall and shoulder over hips and knees 03/12/15   Time 6   Period Weeks   Status On-going   PT LONG TERM GOAL #2   Title Pain free resistive strength Lt shoulder 03/12/15   Time 6   Period Weeks   Status On-going   PT LONG TERM GOAL #3   Title Decrease pain Lt shouder allowing patient to  dress and undress - including bra - without pain 03/12/15   Time 6   Period Weeks   Status On-going   PT LONG TERM GOAL #4   Title I in HEP 03/12/15   Time 6   Period Weeks   Status On-going   PT LONG TERM GOAL #5   Title Improve FOTO to </= 28% limitation 03/12/15   Time 6   Period Weeks   Status On-going               Plan - 02/12/15 1622    Clinical Impression Statement Very good response to manual techniques. Note improve tissue extensibilit; improved posture and decresed c/o pain. Should respond well to TDN.    Pt will benefit from skilled therapeutic intervention in order to improve on the following deficits Postural dysfunction;Improper body mechanics;Decreased range of motion;Decreased mobility;Increased fascial restricitons;Decreased endurance;Decreased activity tolerance;Pain   Rehab Potential Good   PT Frequency 2x / week   PT Duration 6 weeks   PT Treatment/Interventions Patient/family education;ADLs/Self Care Home Management;Manual techniques;Dry needling;Therapeutic exercise;Therapeutic activities;Neuromuscular re-education;Ultrasound;Cryotherapy;Electrical Stimulation;Moist Heat   PT Next Visit Plan continue with postural correction; stretching; cont manual work for [employer] upper quarter. Trial of TDN as schedule allows    PT Home Exercise Plan postural correction; HEP; myofacial ball release    Consulted and Agree with Plan of Care Patient        Problem List Patient Active Problem List   Diagnosis Date Noted  . Left shoulder pain 01/22/2015    Avenly Roberge Rober Minion PT, MPH  02/12/2015, 4:24 PM  [facility] [address] [place], [place], [address] Phone: [phone]   Fax:  [phone]  Name: Tracy [name] MRN: [id number] Date of Birth: [birth year]

## 2015-02-15 ENCOUNTER — Ambulatory Visit (INDEPENDENT_AMBULATORY_CARE_PROVIDER_SITE_OTHER): Payer: BLUE CROSS/BLUE SHIELD | Admitting: Rehabilitative and Restorative Service Providers"

## 2015-02-15 ENCOUNTER — Encounter: Payer: Self-pay | Admitting: Rehabilitative and Restorative Service Providers"

## 2015-02-15 DIAGNOSIS — M623 Immobility syndrome (paraplegic): Secondary | ICD-10-CM | POA: Diagnosis not present

## 2015-02-15 DIAGNOSIS — M25512 Pain in left shoulder: Secondary | ICD-10-CM

## 2015-02-15 DIAGNOSIS — M2560 Stiffness of unspecified joint, not elsewhere classified: Secondary | ICD-10-CM

## 2015-02-15 DIAGNOSIS — R293 Abnormal posture: Secondary | ICD-10-CM

## 2015-02-15 DIAGNOSIS — M6289 Other specified disorders of muscle: Secondary | ICD-10-CM

## 2015-02-15 DIAGNOSIS — M629 Disorder of muscle, unspecified: Secondary | ICD-10-CM

## 2015-02-15 DIAGNOSIS — Z7409 Other reduced mobility: Secondary | ICD-10-CM

## 2015-02-15 NOTE — Therapy (Signed)
[facility] [place] [address] [place], [place], [address] Phone: [phone]   Fax:  [phone]  Physical Therapy Treatment  Patient Details  Name: Tracy [name] MRN: [id number] Date of Birth: July 16, 1965 Referring Provider: Benjamin Stain  Encounter Date: 02/15/2015      PT End of Session - 02/15/15 1516    Visit Number 6   Number of Visits 12   Date for PT Re-Evaluation 03/12/15   PT Start Time 1518   PT Stop Time 1612   PT Time Calculation (min) 54 min      History reviewed. No pertinent past medical history.  Past Surgical History  Procedure Laterality Date  . Wisdom tooth extraction    . Toe surgery      There were no vitals filed for this visit.  Visit Diagnosis:  Pain in shoulder region, left  Abnormal posture  Muscular imbalance  Stiffness due to immobility      Subjective Assessment - 02/15/15 1519    Subjective Improving. Lt side feels some better; especially neck. Now having some pain on the Rt side under the shoulder blade area which started last night - not sure what she may have doen to irritate the Rt side. Doing exercises at home.    Currently in Pain? Yes   Pain Score 1    Pain Location Shoulder   Pain Orientation Left;Anterior   Pain Descriptors / Indicators Aching;Dull   Pain Radiating Towards up into the neck but much less - still feeling pain down the Lt arm    Pain Onset More than a month ago   Multiple Pain Sites Yes   Pain Score 2   Pain Location Shoulder   Pain Orientation Right;Distal;Posterior   Pain Descriptors / Indicators Sore;Aching   Pain Type Acute pain   Pain Onset Yesterday   Pain Frequency Constant   Aggravating Factors  unknown   Pain Relieving Factors unknown             [facility] PT Assessment - 02/15/15 0001    Assessment   Medical Diagnosis Lt shoulder pain    Referring Provider Thekkekandam   Onset Date/Surgical Date 10/01/14   Hand Dominance Left   Next  MD Visit 02/19/15   Prior Therapy none   AROM   Left Shoulder Flexion 155 Degrees  discomfort   Left Shoulder ABduction 153 Degrees  discomfort   Left Shoulder External Rotation 91 Degrees  shd at 90 deg abd   Cervical - Right Side Bend 42  tight   Cervical - Left Side Bend 38  tight   Cervical - Left Rotation 62   Palpation   Palpation comment less pain and tightness Lt pecs/anterior shoulder/upper trap/ant-lat-post cervical musculature                     [facility] Adult PT Treatment/Exercise - 02/15/15 0001    Neck Exercises: Machines for Strengthening   UBE (Upper Arm Bike) L3 4 min half fwd/half back    Neck Exercises: Seated   Lateral Flexion Right;Left  2 reps 20 sec hold    Neck Exercises: Supine   Neck Retraction 10 reps;10 secs   Shoulder Exercises: Supine   Horizontal ABduction Strengthening;Both;15 reps   Theraband Level (Shoulder Horizontal ABduction) Level 2 (Red)   External Rotation Strengthening;Both;10 reps;Theraband   Theraband Level (Shoulder External Rotation) Level 1 (Yellow)   Flexion Both;10 reps  bilat overhead pull, to tolerance   Theraband Level (Shoulder Flexion)  Level 1 (Yellow)   Shoulder Exercises: Standing   Flexion AAROM  slide hand up doorway 20 sec hold x 2    Extension Both;Theraband;20 reps   Theraband Level (Shoulder Extension) Level 2 (Red)   Row Both;Theraband;20 reps   Theraband Level (Shoulder Row) Level 2 (Red)   Retraction Both;20 reps;Theraband   Theraband Level (Shoulder Retraction) Level 1 (Yellow)   Other Standing Exercises scap squeeze with noodle 10 sec x 10    Shoulder Exercises: Stretch   Corner Stretch Limitations doorway 3 positions 30 sec x 2 each; plus lower biceps/ant chest position    Other Shoulder Stretches IR stretch with strap x 3 reps    Other Shoulder Stretches biceps stretch 20-30 sec x 3    Moist Heat Therapy   Number Minutes Moist Heat 15 Minutes   Moist Heat Location Shoulder;Cervical    Electrical Stimulation   Electrical Stimulation Location Lt shoulder/ B cervical paraspinals    Electrical Stimulation Action IFC   Electrical Stimulation Parameters to tolerance    Electrical Stimulation Goals Pain;Tone   Manual Therapy   Manual Therapy --  patient supine    Joint Mobilization cervical PA mobs; Lt to Rt lat glides Grade 11    Soft tissue mobilization moderate pressure with trigger point work through the Rt paracervical mm ~ C5; Lt ant/lat/post cervical mm; upper trap; leveator; pecs; teres/lats(very tender); deltoid    Scapular Mobilization lateral glide Lt/Rt   Passive ROM cervical stretch into flexion forward and w/ slight rotation; PROM and stretching Lt shoulder into flex/ER and IR at 90 deg abd    Manual Traction cervical traction                 PT Education - 02/15/15 1533    Education provided Yes   Education Details HEP; posture   Person(s) Educated Patient   Methods Explanation;Demonstration;Tactile cues;Verbal cues;Handout   Comprehension Verbalized understanding;Returned demonstration;Verbal cues required;Tactile cues required             PT Long Term Goals - 02/15/15 1614    PT LONG TERM GOAL #1   Title Improve posture and alignment with patient to demonstrate good alignment with scapulae in good position along the thoracic wall and shoulder over hips and knees 03/12/15   Time 6   Period Weeks   Status On-going   PT LONG TERM GOAL #2   Title Pain free resistive strength Lt shoulder 03/12/15   Time 6   Period Weeks   Status On-going   PT LONG TERM GOAL #3   Title Decrease pain Lt shouder allowing patient to dress and undress - including bra - without pain 03/12/15   Time 6   Period Weeks   Status On-going   PT LONG TERM GOAL #4   Title I in HEP 03/12/15   Time 6   Period Weeks   Status On-going   PT LONG TERM GOAL #5   Title Improve FOTO to </= 28% limitation 03/12/15   Time 6   Period Weeks   Status On-going                Plan - 02/15/15 1611    Clinical Impression Statement Great response to manual work. Decreased pain and tightness to palpation Lt upper quarter; increased ROM. Continues to have end range Lt shoulder PROM/AROM and some pain/discomfort. Continues to have active TP's Lt lateral cervical/traps/leveator/pecs/biceps/deltoid/teres. Muscular tightness and pain Rt medial inferior scapular area today that responded well to  treatment. Pt is progressing well toward stated goals of treatment.    Pt will benefit from skilled therapeutic intervention in order to improve on the following deficits Postural dysfunction;Improper body mechanics;Decreased range of motion;Decreased mobility;Increased fascial restricitons;Decreased endurance;Decreased activity tolerance;Pain   Rehab Potential Good   PT Frequency 2x / week   PT Duration 6 weeks   PT Treatment/Interventions Patient/family education;ADLs/Self Care Home Management;Manual techniques;Dry needling;Therapeutic exercise;Therapeutic activities;Neuromuscular re-education;Ultrasound;Cryotherapy;Electrical Stimulation;Moist Heat   PT Next Visit Plan continue with postural correction; stretching; cont manual work for [employer] upper quarter. Trial of TDN as schedule allows    PT Home Exercise Plan postural correction; HEP; myofacial ball release    Consulted and Agree with Plan of Care Patient        Problem List Patient Active Problem List   Diagnosis Date Noted  . Left shoulder pain 01/22/2015    Celyn Rober Minion PT, MPH  02/15/2015, 4:17 PM  [facility] [address] [place], [place], [address] Phone: [phone]   Fax:  [phone]  Name: Terrianne Cavness MRN: [id number] Date of Birth: 16-Nov-1965

## 2015-02-15 NOTE — Patient Instructions (Signed)

## 2015-02-17 ENCOUNTER — Ambulatory Visit (INDEPENDENT_AMBULATORY_CARE_PROVIDER_SITE_OTHER): Payer: BLUE CROSS/BLUE SHIELD | Admitting: Physical Therapy

## 2015-02-17 DIAGNOSIS — M2560 Stiffness of unspecified joint, not elsewhere classified: Secondary | ICD-10-CM

## 2015-02-17 DIAGNOSIS — R293 Abnormal posture: Secondary | ICD-10-CM

## 2015-02-17 DIAGNOSIS — M629 Disorder of muscle, unspecified: Secondary | ICD-10-CM

## 2015-02-17 DIAGNOSIS — M623 Immobility syndrome (paraplegic): Secondary | ICD-10-CM

## 2015-02-17 DIAGNOSIS — M25512 Pain in left shoulder: Secondary | ICD-10-CM | POA: Diagnosis not present

## 2015-02-17 DIAGNOSIS — Z7409 Other reduced mobility: Secondary | ICD-10-CM

## 2015-02-17 DIAGNOSIS — M6289 Other specified disorders of muscle: Secondary | ICD-10-CM

## 2015-02-17 NOTE — Therapy (Signed)
[facility] [place] [address] [place], [place], [address] Phone: [phone]   Fax:  [phone]  Physical Therapy Treatment  Patient Details  Name: Tracy [name] MRN: [id number] Date of Birth: 1965/10/18 Referring Provider: Dr. Briant Sites  Encounter Date: 02/17/2015      PT End of Session - 02/17/15 1528    Visit Number 7   Number of Visits 12   Date for PT Re-Evaluation 03/12/15   PT Start Time 1517   PT Stop Time 1616   PT Time Calculation (min) 59 min      No past medical history on file.  Past Surgical History  Procedure Laterality Date  . Wisdom tooth extraction    . Toe surgery      There were no vitals filed for this visit.  Visit Diagnosis:  Pain in shoulder region, left  Abnormal posture  Muscular imbalance  Stiffness due to immobility      Subjective Assessment - 02/17/15 1525    Subjective Pt reports the pain in Lt shoulder is now giving her difficulties sleeping.  Not sure if it is the exercises or that the injection is wearing off.  Stretches every day, band strengthening "sporadic". Lt neck has calmed down a lot.    Currently in Pain? Yes   Pain Score 1    Pain Location Shoulder   Pain Orientation Left;Anterior   Pain Descriptors / Indicators Aching;Dull   Aggravating Factors  sleeping on Lt arm, moving arm in certain ways.    Pain Relieving Factors stretches, massage.             [facility] PT Assessment - 02/17/15 0001    Assessment   Medical Diagnosis Lt shoulder pain    Referring Provider Dr. Briant Sites   Onset Date/Surgical Date 10/01/14   Hand Dominance Left   Next MD Visit 02/19/15   Prior Therapy none   ROM / Strength   AROM / PROM / Strength AROM   AROM   AROM Assessment Site Shoulder;Cervical   Right Shoulder Flexion 164 Degrees   Left Shoulder Flexion 155 Degrees   Left Shoulder ABduction 149 Degrees   Left Shoulder Internal Rotation 67 Degrees  to bottom of bra strap,  with pain   Left Shoulder External Rotation 90 Degrees   Cervical - Right Side Bend 45  tightness at end range, on left neck   Cervical - Left Side Bend 45         [facility] Adult PT Treatment/Exercise - 02/17/15 0001    Shoulder Exercises: Supine   Horizontal ABduction Strengthening;Both;10 reps;Theraband   Theraband Level (Shoulder Horizontal ABduction) Level 3 (Green)   External Rotation Strengthening;Both;10 reps;Theraband  2 sets, one with each color   Theraband Level (Shoulder External Rotation) Level 2 (Red);Level 3 (Green)   Flexion Both;10 reps  bilat overhead pull, to tolerance   Theraband Level (Shoulder Flexion) Level 2 (Red)   Other Supine Exercises Sash with red band x 10 reps LUE, to tolerance    Other Supine Exercises hooklying on 1/2 foam roll (pool noodle increased pain) : snow angels with 10 sec hold with arms ~120 deg abd x 15 reps   Shoulder Exercises: Stretch   Other Shoulder Stretches doorway 3 positions 30 sec x 2 each; plus lower biceps/ant chest position   high position caused increased pain; stopped    Moist Heat Therapy   Number Minutes Moist Heat 15 Minutes   Moist Heat Location Shoulder   Electrical Stimulation  Electrical Stimulation Location Lt shoulder/ B cervical paraspinals    Electrical Stimulation Action IFC   Electrical Stimulation Parameters to tolerance    Electrical Stimulation Goals Pain;Tone   Neck Exercises: Stretches   Upper Trapezius Stretch 2 reps;20 seconds           PT Long Term Goals - 02/15/15 1614    PT LONG TERM GOAL #1   Title Improve posture and alignment with patient to demonstrate good alignment with scapulae in good position along the thoracic wall and shoulder over hips and knees 03/12/15   Time 6   Period Weeks   Status On-going   PT LONG TERM GOAL #2   Title Pain free resistive strength Lt shoulder 03/12/15   Time 6   Period Weeks   Status On-going   PT LONG TERM GOAL #3   Title Decrease pain Lt shouder  allowing patient to dress and undress - including bra - without pain 03/12/15   Time 6   Period Weeks   Status On-going   PT LONG TERM GOAL #4   Title I in HEP 03/12/15   Time 6   Period Weeks   Status On-going   PT LONG TERM GOAL #5   Title Improve FOTO to </= 28% limitation 03/12/15   Time 6   Period Weeks   Status On-going               Plan - 02/17/15 1716    Clinical Impression Statement Pt's Lt shoulder ROM remains similar to last few sessions, neck lateral flexion improved.  She tolerated increased resistance with therapeutic exercise in supine.  Pt reporting increased neck/shoulder (Lt) with doorway stretch; encouraged her to hold off on this and stretch in supine with bolster.  Pt reported decrease in Lt shoulder pain with use of MHP and estim at end of session. Gradual progression towards goals.    Pt will benefit from skilled therapeutic intervention in order to improve on the following deficits Postural dysfunction;Improper body mechanics;Decreased range of motion;Decreased mobility;Increased fascial restricitons;Decreased endurance;Decreased activity tolerance;Pain   Rehab Potential Good   PT Frequency 2x / week   PT Duration 6 weeks   PT Treatment/Interventions Patient/family education;ADLs/Self Care Home Management;Manual techniques;Dry needling;Therapeutic exercise;Therapeutic activities;Neuromuscular re-education;Ultrasound;Cryotherapy;Electrical Stimulation;Moist Heat   PT Next Visit Plan continue with postural correction; stretching; cont manual work for [employer] upper quarter. Trial of TDN as schedule allows    Consulted and Agree with Plan of Care Patient        Problem List Patient Active Problem List   Diagnosis Date Noted  . Left shoulder pain 01/22/2015    Mayer Camel, PTA 02/17/2015 5:18 PM  [facility] [place] [address] [place], [place], [address] Phone: [phone]   Fax:   [phone]  Name: Tracy [name] MRN: [id number] Date of Birth: December 01, 1965

## 2015-02-19 ENCOUNTER — Ambulatory Visit (INDEPENDENT_AMBULATORY_CARE_PROVIDER_SITE_OTHER): Payer: BLUE CROSS/BLUE SHIELD | Admitting: Sports Medicine

## 2015-02-19 ENCOUNTER — Encounter: Payer: Self-pay | Admitting: Sports Medicine

## 2015-02-19 VITALS — BP 167/88 | HR 77 | Resp 18 | Wt 273.1 lb

## 2015-02-19 DIAGNOSIS — M25512 Pain in left shoulder: Secondary | ICD-10-CM | POA: Diagnosis not present

## 2015-02-19 DIAGNOSIS — M5412 Radiculopathy, cervical region: Secondary | ICD-10-CM | POA: Diagnosis not present

## 2015-02-19 MED ORDER — MELOXICAM 15 MG PO TABS: ORAL_TABLET | ORAL | Status: DC

## 2015-02-19 NOTE — Progress Notes (Signed)
  Subjective:    CC: follow-up  HPI: This is a pleasant 50 year old female, she had both shoulder and neck pain at the last visit, we did injections into the glenohumeral and subacromial spaces, she didn't have much of a response to this, nor did she have a good response to aggressive formal physical therapy, not taking her meloxicam. Symptoms have evolved, and now moved from the neck, down the arm, to the second and third fingers. Pain is moderate, persistent, no trauma, no constitutional symptoms.  Past medical history, Surgical history, Family history not pertinant except as noted below, Social history, Allergies, and medications have been entered into the medical record, reviewed, and no changes needed.   Review of Systems: No fevers, chills, night sweats, weight loss, chest pain, or shortness of breath.   Objective:    General: Well Developed, well nourished, and in no acute distress.  Neuro: Alert and oriented x3, extra-ocular muscles intact, sensation grossly intact.  HEENT: Normocephalic, atraumatic, pupils equal round reactive to light, neck supple, no masses, no lymphadenopathy, thyroid nonpalpable.  Skin: Warm and dry, no rashes. Cardiac: Regular rate and rhythm, no murmurs rubs or gallops, no lower extremity edema.  Respiratory: Clear to auscultation bilaterally. Not using accessory muscles, speaking in full sentences.  Impression and Recommendations:    I spent 25 minutes with this patient, greater than 50% was face-to-face time counseling regarding the above diagnoses

## 2015-02-19 NOTE — Assessment & Plan Note (Signed)
Subacromial and glenohumeral injections were ineffective. Physical therapy was only minimally affected. At this point symptoms have involved to represent predominantly neck pain with radiation down to the second and third fingers highly suggestive of a C7 radiculopathy. At this point we are going to proceed with an MRI for interventional injection planning, she will go ahead intake her meloxicam

## 2015-02-24 ENCOUNTER — Encounter: Payer: Self-pay | Admitting: Physical Therapy

## 2015-02-24 ENCOUNTER — Ambulatory Visit (INDEPENDENT_AMBULATORY_CARE_PROVIDER_SITE_OTHER): Payer: BLUE CROSS/BLUE SHIELD | Admitting: Physical Therapy

## 2015-02-24 DIAGNOSIS — Z7409 Other reduced mobility: Secondary | ICD-10-CM

## 2015-02-24 DIAGNOSIS — R293 Abnormal posture: Secondary | ICD-10-CM | POA: Diagnosis not present

## 2015-02-24 DIAGNOSIS — M623 Immobility syndrome (paraplegic): Secondary | ICD-10-CM | POA: Diagnosis not present

## 2015-02-24 DIAGNOSIS — M629 Disorder of muscle, unspecified: Secondary | ICD-10-CM

## 2015-02-24 DIAGNOSIS — M2560 Stiffness of unspecified joint, not elsewhere classified: Secondary | ICD-10-CM

## 2015-02-24 DIAGNOSIS — M25512 Pain in left shoulder: Secondary | ICD-10-CM | POA: Diagnosis not present

## 2015-02-24 DIAGNOSIS — M6289 Other specified disorders of muscle: Secondary | ICD-10-CM

## 2015-02-24 NOTE — Therapy (Signed)
[facility] Out[facility] [place] [address] [place], [place], [address] Phone: [phone]   Fax:  [phone]  Physical Therapy Treatment  Patient Details  Name: Tracy [name] MRN: [id number] Date of Birth: [birth year] Referring Provider: Dr. Briant Sites  Encounter Date: 02/24/2015      PT End of Session - 02/24/15 1521    Visit Number 8   Number of Visits 12   Date for PT Re-Evaluation 03/12/15   PT Start Time 1521   PT Stop Time 1617   PT Time Calculation (min) 56 min   Activity Tolerance Patient limited by pain      History reviewed. No pertinent past medical history.  Past Surgical History  Procedure Laterality Date  . Wisdom tooth extraction    . Toe surgery      There were no vitals filed for this visit.  Visit Diagnosis:  Pain in shoulder region, left  Abnormal posture  Muscular imbalance  Stiffness due to immobility      Subjective Assessment - 02/24/15 1522    Subjective Pt saw the MD and he is wondering if the issue is coming from her neck, since her pain is into the arm.  she will have an MRI on Monday and will follow up with MD after.     Currently in Pain? Yes   Pain Score 2    Pain Orientation Left   Pain Descriptors / Indicators Aching;Dull   Pain Type Chronic pain   Pain Radiating Towards to elbow and in deltoid   Pain Frequency Constant   Aggravating Factors  sleeping on the Lt side, lift heavy stuff   Pain Relieving Factors stretching and massage.                         [facility] Adult PT Treatment/Exercise - 02/24/15 0001    Exercises   Exercises Neck;Shoulder   Neck Exercises: Machines for Strengthening   UBE (Upper Arm Bike) L3 5 min half fwd/half back    Neck Exercises: Standing   Other Standing Exercises shoulder shrugs x 20   Shoulder Exercises: Stretch   Other Shoulder Stretches doorway 2 positions 30 sec x 2 each; plus lower biceps/ant chest position    Other Shoulder  Stretches neck side bend stretches for the LT and BWD circle rolls   Moist Heat Therapy   Number Minutes Moist Heat 15 Minutes   Moist Heat Location Cervical;Shoulder  Lt   Electrical Stimulation   Electrical Stimulation Location Lt shoulder and neck   Electrical Stimulation Action IFC    Electrical Stimulation Parameters to tolerance   Electrical Stimulation Goals Pain;Tone   Manual Therapy   Manual Therapy Joint mobilization;Soft tissue mobilization   Joint Mobilization cervical CPA, Bilat UPA, pain with Rt C3 UPA mobs   Soft tissue mobilization Lt upper trap, pericervical muscles.           Trigger Point Dry Needling - 02/24/15 1550    Consent Given? Yes   Education Handout Provided Yes   Muscles Treated Upper Body Upper trapezius;Oblique capitus;Suboccipitals muscle group  Lt , cervical paraspinals bilat   Upper Trapezius Response Twitch reponse elicited;Palpable increased muscle length   Oblique Capitus Response Twitch response elicited;Palpable increased muscle length  Rt    SubOccipitals Response Twitch response elicited;Palpable increased muscle length  bilat              PT Education - 02/24/15 1610    Education provided Yes  Education Details TDN   Person(s) Educated Patient   Methods Explanation   Comprehension Verbalized understanding             PT Long Term Goals - 02/24/15 1612    PT LONG TERM GOAL #1   Title Improve posture and alignment with patient to demonstrate good alignment with scapulae in good position along the thoracic wall and shoulder over hips and knees 03/12/15   Time 6   Period Weeks   Status On-going   PT LONG TERM GOAL #2   Title Pain free resistive strength Lt shoulder 03/12/15   Time 6   Period Weeks   Status On-going   PT LONG TERM GOAL #3   Title Decrease pain Lt shouder allowing patient to dress and undress - including bra - without pain 03/12/15   Period Weeks   Status On-going   PT LONG TERM GOAL #4   Title I in  HEP 03/12/15   Time 6   Period Weeks   Status On-going   PT LONG TERM GOAL #5   Title Improve FOTO to </= 28% limitation 03/12/15   Time 6   Period Weeks   Status On-going               Plan - 02/24/15 1610    Clinical Impression Statement Veralyn had strong contractions with TDN on the Lt side.  She also had nice releases after with decreased pain with motion. She made need another session of this to reduce her tightness more.  She also had significant tightness in bilat cervial parapsinals.    Pt will benefit from skilled therapeutic intervention in order to improve on the following deficits Postural dysfunction;Improper body mechanics;Decreased range of motion;Decreased mobility;Increased fascial restricitons;Decreased endurance;Decreased activity tolerance;Pain   Rehab Potential Good   PT Frequency 2x / week   PT Duration 6 weeks   PT Treatment/Interventions Patient/family education;ADLs/Self Care Home Management;Manual techniques;Dry needling;Therapeutic exercise;Therapeutic activities;Neuromuscular re-education;Ultrasound;Cryotherapy;Electrical Stimulation;Moist Heat   PT Next Visit Plan assess response to TDN   Consulted and Agree with Plan of Care Patient        Problem List Patient Active Problem List   Diagnosis Date Noted  . Left cervical radiculopathy 01/22/2015    Roderic Scarce PT 02/24/2015, 4:13 PM  [facility] [address] [place], [place], [address] Phone: [phone]   Fax:  [phone]  Name: Uzma Hellmer MRN: [id number] Date of Birth: [birth year]

## 2015-02-26 ENCOUNTER — Encounter: Payer: Self-pay | Admitting: Rehabilitative and Restorative Service Providers"

## 2015-02-26 ENCOUNTER — Ambulatory Visit (INDEPENDENT_AMBULATORY_CARE_PROVIDER_SITE_OTHER): Payer: BLUE CROSS/BLUE SHIELD | Admitting: Rehabilitative and Restorative Service Providers"

## 2015-02-26 DIAGNOSIS — M25512 Pain in left shoulder: Secondary | ICD-10-CM

## 2015-02-26 DIAGNOSIS — R293 Abnormal posture: Secondary | ICD-10-CM | POA: Diagnosis not present

## 2015-02-26 DIAGNOSIS — M629 Disorder of muscle, unspecified: Secondary | ICD-10-CM

## 2015-02-26 DIAGNOSIS — M6289 Other specified disorders of muscle: Secondary | ICD-10-CM

## 2015-02-26 DIAGNOSIS — M623 Immobility syndrome (paraplegic): Secondary | ICD-10-CM

## 2015-02-26 DIAGNOSIS — M2560 Stiffness of unspecified joint, not elsewhere classified: Secondary | ICD-10-CM

## 2015-02-26 DIAGNOSIS — Z7409 Other reduced mobility: Secondary | ICD-10-CM

## 2015-02-26 NOTE — Therapy (Signed)
[facility] Out[facility] [place] [address] [place], [place], [address] Phone: [phone]   Fax:  [phone]  Physical Therapy Treatment  Patient Details  Name: Tracy [name] MRN: [id number] Date of Birth: Dec 24, 1965 Referring Provider: Dr. Briant Sites  Encounter Date: 02/26/2015      PT End of Session - 02/26/15 1534    Visit Number 9   Number of Visits 12   Date for PT Re-Evaluation 03/12/15   PT Start Time 1532   PT Stop Time 1626   PT Time Calculation (min) 54 min   Activity Tolerance Patient tolerated treatment well      History reviewed. No pertinent past medical history.  Past Surgical History  Procedure Laterality Date  . Wisdom tooth extraction    . Toe surgery      There were no vitals filed for this visit.  Visit Diagnosis:  Pain in shoulder region, left  Abnormal posture  Muscular imbalance  Stiffness due to immobility      Subjective Assessment - 02/26/15 1533    Subjective Thinks the TDN helped. She is still tight in her neck and has some ache in the arm but no pain today.    Currently in Pain? No/denies                         [facility] Adult PT Treatment/Exercise - 02/26/15 0001    Exercises   Exercises Neck;Shoulder   Neck Exercises: Machines for Strengthening   UBE (Upper Arm Bike) L3 4 min half fwd/half back    Neck Exercises: Standing   Other Standing Exercises shoulder shrugs x 20   Neck Exercises: Supine   Neck Retraction 10 reps;10 secs   Shoulder Exercises: Supine   Horizontal ABduction Strengthening;Both;10 reps;Theraband   Theraband Level (Shoulder Horizontal ABduction) Level 3 (Green)   External Rotation Strengthening;Both;10 reps;Theraband   Theraband Level (Shoulder External Rotation) Level 2 (Red);Level 3 (Green)   Flexion Both;10 reps   Theraband Level (Shoulder Flexion) Level 2 (Red)   Shoulder Exercises: Standing   Extension Both;Theraband;20 reps   Theraband  Level (Shoulder Extension) Level 2 (Red)   Row Both;Theraband;20 reps   Theraband Level (Shoulder Row) Level 2 (Red)   Retraction Both;20 reps;Theraband   Theraband Level (Shoulder Retraction) Level 1 (Yellow)   Shoulder Exercises: Pulleys   Flexion --  10 sec x 10   ABduction --  10 sec hold x 10    Other Pulley Exercises IR 20 sec x 3   Shoulder Exercises: Stretch   Corner Stretch Limitations doorway 3 positions 30 sec x 2 each; plus lower biceps/ant chest position    Moist Heat Therapy   Number Minutes Moist Heat 15 Minutes   Moist Heat Location Cervical;Shoulder  Lt   Electrical Stimulation   Electrical Stimulation Location Lt shoulder and neck   Electrical Stimulation Action IFC   Electrical Stimulation Parameters to tolerance   Electrical Stimulation Goals Pain;Tone   Manual Therapy   Manual Therapy Joint mobilization;Soft tissue mobilization   Joint Mobilization cervical CPA, Bilat UPA mobs   Soft tissue mobilization Lt upper trap, pericervical muscles; ant/lat/post cervical musculature; pecs   Scapular Mobilization lateral glide Lt/Rt   Passive ROM cervical stretch into flexion forward and w/ slight rotation; PROM and stretching Lt shoulder into flex/ER and IR at 90 deg abd    Manual Traction cervical traction    Neck Exercises: Stretches   Upper Trapezius Stretch 2 reps;20 seconds  PT Long Term Goals - 02/24/15 1612    PT LONG TERM GOAL #1   Title Improve posture and alignment with patient to demonstrate good alignment with scapulae in good position along the thoracic wall and shoulder over hips and knees 03/12/15   Time 6   Period Weeks   Status On-going   PT LONG TERM GOAL #2   Title Pain free resistive strength Lt shoulder 03/12/15   Time 6   Period Weeks   Status On-going   PT LONG TERM GOAL #3   Title Decrease pain Lt shouder allowing patient to dress and undress - including bra - without pain 03/12/15   Period Weeks   Status  On-going   PT LONG TERM GOAL #4   Title I in HEP 03/12/15   Time 6   Period Weeks   Status On-going   PT LONG TERM GOAL #5   Title Improve FOTO to </= 28% limitation 03/12/15   Time 6   Period Weeks   Status On-going               Plan - 02/26/15 1534    Clinical Impression Statement Good response to TDN with less tightness and pain. Pt is progressing well with treatment. continues to have some symptoms but is improving. She may benefit form more TDN treatments. Progressing well toward goals of therapy.    Pt will benefit from skilled therapeutic intervention in order to improve on the following deficits Postural dysfunction;Improper body mechanics;Decreased range of motion;Decreased mobility;Increased fascial restricitons;Decreased endurance;Decreased activity tolerance;Pain   Rehab Potential Good   PT Frequency 2x / week   PT Duration 6 weeks   PT Treatment/Interventions Patient/family education;ADLs/Self Care Home Management;Manual techniques;Dry needling;Therapeutic exercise;Therapeutic activities;Neuromuscular re-education;Ultrasound;Cryotherapy;Electrical Stimulation;Moist Heat   PT Home Exercise Plan postural correction; HEP; myofacial ball release    Consulted and Agree with Plan of Care Patient        Problem List Patient Active Problem List   Diagnosis Date Noted  . Left cervical radiculopathy 01/22/2015    Nery Kalisz Rober Minion PT, MPH  02/26/2015, 4:22 PM  [facility] [address] [place], [place], [address] Phone: [phone]   Fax:  [phone]  Name: Tracy [name] MRN: [id number] Date of Birth: [birth year]

## 2015-03-01 ENCOUNTER — Ambulatory Visit (INDEPENDENT_AMBULATORY_CARE_PROVIDER_SITE_OTHER): Payer: BLUE CROSS/BLUE SHIELD

## 2015-03-01 DIAGNOSIS — M5412 Radiculopathy, cervical region: Secondary | ICD-10-CM

## 2015-03-01 DIAGNOSIS — M4802 Spinal stenosis, cervical region: Secondary | ICD-10-CM

## 2015-03-08 ENCOUNTER — Ambulatory Visit (INDEPENDENT_AMBULATORY_CARE_PROVIDER_SITE_OTHER): Payer: BLUE CROSS/BLUE SHIELD | Admitting: Physical Therapy

## 2015-03-08 ENCOUNTER — Encounter: Payer: Self-pay | Admitting: Physical Therapy

## 2015-03-08 DIAGNOSIS — R293 Abnormal posture: Secondary | ICD-10-CM | POA: Diagnosis not present

## 2015-03-08 DIAGNOSIS — M623 Immobility syndrome (paraplegic): Secondary | ICD-10-CM

## 2015-03-08 DIAGNOSIS — Z7409 Other reduced mobility: Secondary | ICD-10-CM

## 2015-03-08 DIAGNOSIS — M629 Disorder of muscle, unspecified: Secondary | ICD-10-CM | POA: Diagnosis not present

## 2015-03-08 DIAGNOSIS — M6289 Other specified disorders of muscle: Secondary | ICD-10-CM

## 2015-03-08 DIAGNOSIS — M25512 Pain in left shoulder: Secondary | ICD-10-CM | POA: Diagnosis not present

## 2015-03-08 DIAGNOSIS — M2560 Stiffness of unspecified joint, not elsewhere classified: Secondary | ICD-10-CM

## 2015-03-08 NOTE — Therapy (Addendum)
[facility] [address] [place], [name], [address] Phone: [phone]   Fax:  [phone]  Physical Therapy Treatment  Patient Details  Name: Tracy [name] MRN: [id number] Date of Birth: 1965/05/02 Referring Provider: Dr Dianah Field  Encounter Date: 03/08/2015      PT End of Session - 03/08/15 1600    Visit Number 10   Number of Visits 12   Date for PT Re-Evaluation 03/12/15   PT Start Time 1600   PT Stop Time 1700   PT Time Calculation (min) 60 min      History reviewed. No pertinent past medical history.  Past Surgical History  Procedure Laterality Date  . Wisdom tooth extraction    . Toe surgery      There were no vitals filed for this visit.  Visit Diagnosis:  Pain in shoulder region, left  Abnormal posture  Muscular imbalance  Stiffness due to immobility      Subjective Assessment - 03/08/15 1600    Subjective Tracy [name] reports she really felt that the TDN really helped.  She was out of town last week and is still doing OK, had her MRI last week and is waiting for MD to call to make an appointment to review.    Currently in Pain? Yes   Pain Score 1    Pain Location Neck   Pain Orientation Left;Right   Pain Descriptors / Indicators Dull   Pain Type Chronic pain   Aggravating Factors  lifting heavy items, sleeping is a little better   Pain Relieving Factors stretching ad TDN            [facility] PT Assessment - 03/08/15 0001    Assessment   Medical Diagnosis Lt shoulder pain    Referring Provider Dr Dianah Field   Onset Date/Surgical Date 10/01/14   Hand Dominance Left   Next MD Visit not scheduled yet   Strength   Overall Strength Comments 5/5bilat shoulders without pain                     [facility] Adult PT Treatment/Exercise - 03/08/15 0001    Neck Exercises: Machines for Strengthening   UBE (Upper Arm Bike) L3 4 min half fwd/half back    Shoulder Exercises: Standing   Extension Strengthening  body blade bilat UE's flexion   Other Standing Exercises wall push ups.    Shoulder Exercises: Stretch   Internal Rotation Stretch 30 seconds   Internal Rotation Stretch Limitations with strap & hand behind back.    Other Shoulder Stretches doorway mid and low x 30 sec   Modalities   Modalities Electrical Stimulation;Moist Heat   Moist Heat Therapy   Number Minutes Moist Heat 15 Minutes   Moist Heat Location Cervical;Shoulder   Electrical Stimulation   Electrical Stimulation Location cervical and upper shoulders   Electrical Stimulation Action IFC   Electrical Stimulation Parameters to tolerance   Electrical Stimulation Goals Pain;Tone   Manual Therapy   Manual Therapy Soft tissue mobilization   Soft tissue mobilization Lt/Rt upper trap, pericervical muscles; ant/lat/post cervical musculature; pecs          Trigger Point Dry Needling - 03/08/15 1605    Consent Given? Yes   Education Handout Provided No   Muscles Treated Upper Body Upper trapezius;Oblique capitus;Suboccipitals muscle group  bilat                   PT Long Term Goals - 03/08/15 1615  PT LONG TERM GOAL #1   Title Improve posture and alignment with patient to demonstrate good alignment with scapulae in good position along the thoracic wall and shoulder over hips and knees 03/12/15   Status On-going   PT LONG TERM GOAL #2   Title Pain free resistive strength Lt shoulder 03/12/15   Status Achieved   PT LONG TERM GOAL #3   Title Decrease pain Lt shouder allowing patient to dress and undress - including bra - without pain 03/12/15   Status On-going  hooking is harder than unhooking   PT LONG TERM GOAL #4   Title I in HEP 03/12/15   Status On-going   PT LONG TERM GOAL #5   Title Improve FOTO to </= 28% limitation 03/12/15               Plan - 03/08/15 1647    Clinical Impression Statement Tracy [name] has had good decrease in overall pain, still with some limitations.  She  has met another goal and will be reassessed at next visit.    Pt will benefit from skilled therapeutic intervention in order to improve on the following deficits Postural dysfunction;Improper body mechanics;Decreased range of motion;Decreased mobility;Increased fascial restricitons;Decreased endurance;Decreased activity tolerance;Pain   Rehab Potential Good   PT Frequency 2x / week   PT Duration 6 weeks   PT Treatment/Interventions Patient/family education;ADLs/Self Care Home Management;Manual techniques;Dry needling;Therapeutic exercise;Therapeutic activities;Neuromuscular re-education;Ultrasound;Cryotherapy;Electrical Stimulation;Moist Heat   PT Next Visit Plan assess response to TDN, FOTO    Consulted and Agree with Plan of Care Patient        Problem List Patient Active Problem List   Diagnosis Date Noted  . Left cervical radiculopathy 01/22/2015    Jeral Pinch PT 03/08/2015, 4:50 PM  [facility] [place], [name], [address] Phone: [phone]   Fax:  [phone]  Name: Tracy [name] MRN: [id number] Date of Birth: December 01, 1965    PHYSICAL THERAPY DISCHARGE SUMMARY  Visits from Start of Care: 10  Current functional level related to goals / functional outcomes: Improved cervical mobility and ROM; decreased muscular tightness to palpation; decreased pain   Remaining deficits: Continued pain    Education / Equipment: HEP  Plan: Patient agrees to discharge.  Patient goals were met. Patient is being discharged due to not returning since the last visit.  ?????    Celyn P. Helene Kelp PT, MPH 03/31/2015 8:26 AM

## 2015-03-11 ENCOUNTER — Encounter: Payer: BLUE CROSS/BLUE SHIELD | Admitting: Physical Therapy

## 2015-03-15 ENCOUNTER — Ambulatory Visit (INDEPENDENT_AMBULATORY_CARE_PROVIDER_SITE_OTHER): Payer: BLUE CROSS/BLUE SHIELD | Admitting: Sports Medicine

## 2015-03-15 DIAGNOSIS — M5412 Radiculopathy, cervical region: Secondary | ICD-10-CM

## 2015-03-15 MED ORDER — GABAPENTIN 300 MG PO CAPS: ORAL_CAPSULE | ORAL | Status: DC

## 2015-03-15 NOTE — Assessment & Plan Note (Signed)
Persistent left-sided C7 radiculopathy however improved. Restart meloxicam, adding gabapentin. MRI does show multilevel disc effusions worse at C5-C6 and C6-C7 level. If insufficient improvement in one month we will proceed with a left C6-C7 interlaminar cervical epidural.

## 2015-03-15 NOTE — Progress Notes (Signed)
  Subjective:    CC: MRI results  HPI: This is a pleasant 50 year old female, she comes in for her MRI result, she has a persistent left C7 radiculopathy that failed to respond to conservative measures. Pain is moderate, persistent. Worse in the arm than the neck.  Past medical history, Surgical history, Family history not pertinant except as noted below, Social history, Allergies, and medications have been entered into the medical record, reviewed, and no changes needed.   Review of Systems: No fevers, chills, night sweats, weight loss, chest pain, or shortness of breath.   Objective:    General: Well Developed, well nourished, and in no acute distress.  Neuro: Alert and oriented x3, extra-ocular muscles intact, sensation grossly intact.  HEENT: Normocephalic, atraumatic, pupils equal round reactive to light, neck supple, no masses, no lymphadenopathy, thyroid nonpalpable.  Skin: Warm and dry, no rashes. Cardiac: Regular rate and rhythm, no murmurs rubs or gallops, no lower extremity edema.  Respiratory: Clear to auscultation bilaterally. Not using accessory muscles, speaking in full sentences.  MRI reviewed and shows multilevel disc protrusions from C4-C7, worse at the C5-C6 and C6-C7 levels.  Impression and Recommendations:   I spent 25 minutes with this patient, greater than 50% was face-to-face time counseling regarding the above diagnoses

## 2015-04-13 ENCOUNTER — Ambulatory Visit: Payer: BLUE CROSS/BLUE SHIELD | Admitting: Sports Medicine

## 2015-10-05 ENCOUNTER — Ambulatory Visit (INDEPENDENT_AMBULATORY_CARE_PROVIDER_SITE_OTHER): Payer: BLUE CROSS/BLUE SHIELD | Admitting: Sports Medicine

## 2015-10-05 ENCOUNTER — Ambulatory Visit (INDEPENDENT_AMBULATORY_CARE_PROVIDER_SITE_OTHER): Payer: BLUE CROSS/BLUE SHIELD

## 2015-10-05 DIAGNOSIS — M51369 Other intervertebral disc degeneration, lumbar region without mention of lumbar back pain or lower extremity pain: Secondary | ICD-10-CM | POA: Insufficient documentation

## 2015-10-05 DIAGNOSIS — M1712 Unilateral primary osteoarthritis, left knee: Secondary | ICD-10-CM

## 2015-10-05 DIAGNOSIS — M5416 Radiculopathy, lumbar region: Secondary | ICD-10-CM

## 2015-10-05 DIAGNOSIS — M170 Bilateral primary osteoarthritis of knee: Secondary | ICD-10-CM

## 2015-10-05 DIAGNOSIS — M5136 Other intervertebral disc degeneration, lumbar region: Secondary | ICD-10-CM | POA: Insufficient documentation

## 2015-10-05 MED ORDER — MELOXICAM 15 MG PO TABS: ORAL_TABLET | ORAL | 3 refills | Status: DC

## 2015-10-05 NOTE — Assessment & Plan Note (Signed)
Likely with a degenerative meniscal tear. Aspiration and injection as above. Formal physical therapy. X-rays. Return to see me in one month, MRI for arthroscopy of no better.

## 2015-10-05 NOTE — Progress Notes (Signed)
   Subjective:    I'm seeing this patient as a consultation for:  Tracy Minksonnay Elkins, NP   CC: L knee pain   HPI: 50 yo presenting with one month of L knee pain.  She localizes pain over the back of her knee and says it runs down her calf.  She has no history of trauma or injury to her L knee.  She describes the pain as "throbbing" and intermittent.  Pain only occurs when going from sitting to standing, with twisting motions, and when going down stairs.  It is worth with knee flexion.  She also has pain over her IT band, described as "throbbing".  Denies any radicular symptoms.  She does have a 2015 MRI of her L-spine that shows disc space narrowing at L3-4 and mild degenerative changes of the lumbar spine.  She has taken Advil for pain, without relief.      Past medical history, Surgical history, Family history not pertinant except as noted below, Social history, Allergies, and medications have been entered into the medical record, reviewed, and no changes needed.   Review of Systems: No headache, visual changes, nausea, vomiting, diarrhea, constipation, dizziness, abdominal pain, skin rash, fevers, chills, night sweats, weight loss, swollen lymph nodes, body aches, joint swelling, muscle aches, chest pain, shortness of breath, mood changes, visual or auditory hallucinations.   Objective:   General: Well Developed, well nourished, and in no acute distress.  Neuro/Psych: Alert and oriented x3, extra-ocular muscles intact, able to move all 4 extremities, sensation grossly intact. Skin: Warm and dry, no rashes noted.  Respiratory: Not using accessory muscles, speaking in full sentences, trachea midline.  Cardiovascular: Pulses palpable, no extremity edema. Abdomen: Does not appear distended. L Knee: Moderate L knee effusion  Palpation normal with no warmth, patellar tenderness, or condyle tenderness. Medial joint line tenderness  ROM full in flexion and extension and lower leg rotation. Ligaments  with solid consistent endpoints including ACL, PCL, LCL, MCL. Positive McMurray's Non painful patellar compression. Patellar glide without crepitus. Patellar and quadriceps tendons unremarkable. Hamstring and quadriceps strength is normal.   Procedure: Real-time Ultrasound Guided aspiration/injection of left knee Device: GE Logiq E  Verbal informed consent obtained.  Time-out conducted.  Noted no overlying erythema, induration, or other signs of local infection.  Skin prepped in a sterile fashion.  Local anesthesia: Topical Ethyl chloride.  With sterile technique and under real time ultrasound guidance:  Using 18-gauge needle aspirated 18 mL straw-colored fluid, syringe switched and 1 mL kenalog 40, 2 mL lidocaine, 2 mL Marcaine injected easily. Completed without difficulty  Pain immediately resolved suggesting accurate placement of the medication.  Advised to call if fevers/chills, erythema, induration, drainage, or persistent bleeding.  Images permanently stored and available for review in the ultrasound unit.  Impression: Technically successful ultrasound guided injection.  Impression and Recommendations:   This case required medical decision making of moderate complexity.  1. L knee pain: likely OA with component of medial meniscal tear -18 cc's sanginous fluid aspirated today.  Steroid injected into L knee.  -X-ray knee  -Formal PT  -Daily meloxicam -Return in one month.  Will consider MRI at that point if pain persists  2. R hip/thigh pain: likely radicular from back  -Formal PT -Daily meloxicam

## 2015-10-05 NOTE — Assessment & Plan Note (Signed)
With multilevel degenerative disc disease on an x-ray sometime ago. She will do formal physical therapy on her back as well. Meloxicam. Return in one month, MRI for interventional planning if no better.

## 2015-10-19 ENCOUNTER — Encounter: Payer: Self-pay | Admitting: Rehabilitative and Restorative Service Providers"

## 2015-10-19 ENCOUNTER — Ambulatory Visit (INDEPENDENT_AMBULATORY_CARE_PROVIDER_SITE_OTHER): Payer: BLUE CROSS/BLUE SHIELD | Admitting: Rehabilitative and Restorative Service Providers"

## 2015-10-19 DIAGNOSIS — R29898 Other symptoms and signs involving the musculoskeletal system: Secondary | ICD-10-CM | POA: Diagnosis not present

## 2015-10-19 DIAGNOSIS — M6281 Muscle weakness (generalized): Secondary | ICD-10-CM | POA: Diagnosis not present

## 2015-10-19 DIAGNOSIS — M25551 Pain in right hip: Secondary | ICD-10-CM

## 2015-10-19 DIAGNOSIS — M25562 Pain in left knee: Secondary | ICD-10-CM

## 2015-10-19 NOTE — Patient Instructions (Signed)
HIP: Hamstrings - Supine   Place strap around foot. Raise leg up, keeping knee straight.  Bend opposite knee to protect back if indicated. Hold 30 seconds. 3 reps per set, 2-3 sets per day     Outer Hip Stretch: Reclined IT Band Stretch (Strap)   Strap around one foot, pull leg across body until you feel a pull or stretch, with shoulders on mat. Hold for 30 seconds. Repeat 3 times each leg. 2-3 times/day.  Piriformis Stretch   Lying on back, pull right knee toward opposite shoulder. Hold 30 seconds. Repeat 3 times. Do 2-3 sessions per day.    Quads / HF, Prone   Lie face down. Grasp one ankle with same-side hand. Use towel if needed to reach. Gently pull foot toward buttock.  Hold 30 seconds. Repeat 3 times per session. Do 2-3 sessions per day.   Trunk: Prone Extension (Press-Ups)    Lie on stomach on firm, flat surface. Relax bottom and legs. Raise chest in air with elbows straight. Keep hips flat on surface, sag stomach. Hold __2-3__ seconds. Repeat __10__ times. Do __2__ sessions per day. CAUTION: Movement should be gentle and slow.   The Stick on amazon   TENS UNIT: This is helpful for muscle pain and spasm.   Search and Purchase a TENS 7000 2nd edition at www.tenspros.com. It should be less than $30.     TENS unit instructions: Do not shower or bathe with the unit on Turn the unit off before removing electrodes or batteries If the electrodes lose stickiness add a drop of water to the electrodes after they are disconnected from the unit and place on plastic sheet. If you continued to have difficulty, call the TENS unit company to purchase more electrodes. Do not apply lotion on the skin area prior to use. Make sure the skin is clean and dry as this will help prolong the life of the electrodes. After use, always check skin for unusual red areas, rash or other skin difficulties. If there are any skin problems, does not apply electrodes to the same area. Never  remove the electrodes from the unit by pulling the wires. Do not use the TENS unit or electrodes other than as directed. Do not change electrode placement without consultating your therapist or physician. Keep 2 fingers with between each electrode.

## 2015-10-19 NOTE — Therapy (Signed)
[facility] [place] [address] [place], [place], [address] Phone: [phone]   Fax:  [phone]  Physical Therapy Evaluation  Patient Details  Name: Tracy [name] MRN: [id number] Date of Birth: 1965/02/18 Referring Provider: Dr Benjamin Stain   Encounter Date: 10/19/2015      PT End of Session - 10/19/15 1335    Visit Number 1   Number of Visits 12   Date for PT Re-Evaluation 11/30/15   PT Start Time 0805   PT Stop Time 0901   PT Time Calculation (min) 56 min   Activity Tolerance Patient tolerated treatment well      History reviewed. No pertinent past medical history.  Past Surgical History:  Procedure Laterality Date  . TOE SURGERY    . WISDOM TOOTH EXTRACTION      There were no vitals filed for this visit.       Subjective Assessment - 10/19/15 0808    Subjective Patient reports that she noticed she was having difficulty going up and down stairs about 2-3 months ago. She started having pain in the back of her Lt  knee and into the calf and at times she would have pain up into the Lt hip. she received a cortisone injection in the Lt knee with good improvement in the knee pain.  Patient also reports some pain in the Lt hip but more in the Rt hip - with prolonged standing. Rt/Lt Hip pain has been present for the past 2 years on and off. She had a fall a few years ago landing on the Rt hip and has some intermittent problems with the Rt hip since that fall.    Pertinent History Patient seen for Lt shoudler pain 2017   How long can you stand comfortably? 5-10 min    How long can you walk comfortably? 1 hour    Diagnostic tests xrays show severe degenerative changes Lt knee    Patient Stated Goals get exercises to strengthening knee    Currently in Pain? No/denies   Pain Score 0-No pain   Pain Location Knee   Pain Orientation Left   Pain Type Acute pain   Pain Onset More than a month ago   Pain Frequency Intermittent    Aggravating Factors  moving sit to stand; stairs; at times standing especially on hard surfaces; squatting    Pain Relieving Factors injection; advil            [facility] PT Assessment - 10/19/15 0001      Assessment   Medical Diagnosis Osteoarthritis Lt knee; Lt hip pain    Referring Provider Dr Benjamin Stain    Onset Date/Surgical Date 08/17/15   Hand Dominance Left   Next MD Visit to schedule after 4 weeks of therapy    Prior Therapy yes for shoulder      Precautions   Precautions None     Balance Screen   Has the patient fallen in the past 6 months No   Has the patient had a decrease in activity level because of a fear of falling?  No   Is the patient reluctant to leave their home because of a fear of falling?  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    Leisure household chores; working out with a trainer 3 days a week - cardio and free weights some weight machines      Observation/Other Assessments   Focus on Therapeutic Outcomes (FOTO)  30% limitation      Observation/Other Assessments-Edema    Edema --  mild edema Lt knee      Sensation   Additional Comments WFL's per pt report      Posture/Postural Control   Posture Comments head forward; increased thoracic kyphosis; flexed forward at hips; weight shifted to the Rt      AROM   Overall AROM Comments end range tightness noted bilat hips in flexion and rotation    Right Knee Extension -4   Right Knee Flexion 123   Left Knee Extension -5   Left Knee Flexion 99     Strength   Overall Strength Comments 5/5 except Lt hip extension 4+/5      Flexibility   Hamstrings ~90 degrees bilat    Quadriceps tight bilat ~90 deg knee flexion    ITB tight  Lt > Rt    Piriformis tight Lt > Rt      Palpation   Patella mobility tender Lt lateral patella    Spinal mobility tight lumbar CPA mobs    SI assessment  pain with palpation Rt SI    Palpation comment tight Rt > Lt QL and lumbar paraspinals                    [facility] Adult PT Treatment/Exercise - 10/19/15 0001      Therapeutic Activites    Therapeutic Activities --  myofacial release work with the massage stick      Knee/Hip Exercises: Stretches   Passive Hamstring Stretch 2 reps;30 seconds   Quad Stretch 3 reps;30 seconds   ITB Stretch 3 reps;30 seconds   Piriformis Stretch 3 reps;30 seconds  varying angles for travell piriformisn stretch    Other Knee/Hip Stretches prone press up 2-3 sec hold x 10      Moist Heat Therapy   Number Minutes Moist Heat 15 Minutes   Moist Heat Location Lumbar Spine;Hip     Cryotherapy   Number Minutes Cryotherapy 15 Minutes   Cryotherapy Location Knee   Type of Cryotherapy Ice pack                PT Education - 10/19/15 0848    Education provided Yes   Education Details HEP; TENS    Person(s) Educated Patient   Methods Explanation;Demonstration;Tactile cues;Verbal cues;Handout   Comprehension Verbalized understanding;Returned demonstration;Verbal cues required;Tactile cues required             PT Long Term Goals - 10/19/15 1340      PT LONG TERM GOAL #1   Title Improve posture and alignment with patient to demonstrate good alignment with  patient to demo good position of trunk over hips and knees with equal weight bearing 11/30/15   Time 6   Period Weeks   Status New     PT LONG TERM GOAL #2   Title Increase AROM bilat knees and hips to [facility] 11/30/15   Time 6   Period Weeks   Status New     PT LONG TERM GOAL #3   Title Decrease pain Lt knee and Rt hip allowing patient to perform normal standing and walking activities for ADL's 11/30/15   Time 6  Period Weeks   Status New     PT LONG TERM GOAL #4    Title I in HEP 11/30/15   Time 6   Period Weeks   Status New     PT LONG TERM GOAL #5   Title Improve FOTO to </= 24% limitation 11/30/15   Time 6   Period Weeks   Status New               Plan - 10/19/15 1335    Clinical Impression Statement Patient presents wth c/o Lt knee pain and Rt hip pain for the past couple of years on an intermittent basis with flare up recently. She has increased pain in the Rt hip > Lt now. Lt knee iw doing well folliwnt injection. Patient has abnormal posture and alignment; limited trunk and LE mobilty; muscular tightness and tenderness to palpation through the Rt QL/lumbar paraspinals/hip abductors; LE weakness; limited functiional activities.    Rehab Potential Good   PT Frequency 2x / week   PT Duration 6 weeks   PT Treatment/Interventions Patient/family education;ADLs/Self Care Home Management;Cryotherapy;Electrical Stimulation;Iontophoresis 4mg /ml Dexamethasone;Moist Heat;Traction;Ultrasound;Neuromuscular re-education;Dry needling;Manual techniques;Therapeutic activities;Therapeutic exercise   PT Next Visit Plan progress with stretching and begin strengthening; manual work (consider TDN - not discussed); modalities; myofacial work   Financial plannerConsulted and Agree with Plan of Care Patient      Patient will benefit from skilled therapeutic intervention in order to improve the following deficits and impairments:  Postural dysfunction, Improper body mechanics, Pain, Decreased range of motion, Decreased mobility, Decreased strength, Increased fascial restricitons, Increased muscle spasms, Decreased activity tolerance  Visit Diagnosis: Acute pain of left knee - Plan: PT plan of care cert/re-cert  Pain in right hip - Plan: PT plan of care cert/re-cert  Muscle weakness (generalized) - Plan: PT plan of care cert/re-cert  Other symptoms and signs involving the musculoskeletal system - Plan: PT plan of care cert/re-cert     Problem List Patient Active  Problem List   Diagnosis Date Noted  . Primary osteoarthritis of left knee 10/05/2015  . Left lumbar radiculopathy 10/05/2015  . Left cervical radiculopathy 01/22/2015    Camauri Craton Rober MinionP Emmaleah Meroney PT, MPH  10/19/2015, 1:46 PM  [facility] [address] [place], [place], [address] Phone: [phone]   Fax:  [phone]  Name: Tracy [name] MRN: [id number] Date of Birth: July 06, 1965

## 2015-11-03 ENCOUNTER — Ambulatory Visit (INDEPENDENT_AMBULATORY_CARE_PROVIDER_SITE_OTHER): Payer: BLUE CROSS/BLUE SHIELD | Admitting: Physical Therapy

## 2015-11-03 DIAGNOSIS — M25551 Pain in right hip: Secondary | ICD-10-CM

## 2015-11-03 DIAGNOSIS — M25562 Pain in left knee: Secondary | ICD-10-CM

## 2015-11-03 DIAGNOSIS — M6281 Muscle weakness (generalized): Secondary | ICD-10-CM

## 2015-11-03 NOTE — Therapy (Signed)
[place], [name], [address] Phone: [phone]   Fax:  [phone]  Physical Therapy Treatment  Patient Details  Name: Tracy [name] MRN: [id number] Date of Birth: [birth year] Referring Provider: Dr. Helane Rima   Encounter Date: 11/03/2015      PT End of Session - 11/03/15 1608    Visit Number 2   Number of Visits 12   Date for PT Re-Evaluation 11/30/15   PT Start Time 1603   PT Stop Time 1656   PT Time Calculation (min) 53 min   Activity Tolerance Patient limited by pain      No past medical history on file.  Past Surgical History:  Procedure Laterality Date  . TOE SURGERY    . WISDOM TOOTH EXTRACTION      There were no vitals filed for this visit.      Subjective Assessment - 11/03/15 1608    Subjective Pt reports she had been doing pretty good.  She felt like her Lt knee had been swelling up again and that the shot was wearing off.  As she was walking across parking lot to therapy, her Lt knee had a "catch" and she has had increased pain.     Currently in Pain? Yes   Pain Score 4    Pain Location Knee   Pain Orientation Left   Aggravating Factors  stairs; sit to/from stand.     Pain Relieving Factors being off of LE.             [facility] PT Assessment - 11/03/15 0001      Assessment   Medical Diagnosis Osteoarthritis Lt knee; Lt hip pain    Referring Provider Dr. Helane Rima    Onset Date/Surgical Date 08/17/15   Hand Dominance Left   Next MD Visit to schedule after 4 weeks of therapy    Prior Therapy yes for shoulder      Flexibility   Quadriceps tight  ~90 deg Lt knee flexion           [facility] Adult PT Treatment/Exercise - 11/03/15 0001      Knee/Hip Exercises: Stretches   Passive Hamstring Stretch 3 reps;Right;Left;30 seconds   Quad Stretch 3 reps;30 seconds   ITB Stretch 3 reps;30 seconds  followed by adductor stretch with strap for 30 sec x 2 reps     Knee/Hip Exercises: Aerobic   Nustep L2: 5 min      Modalities   Modalities Electrical Stimulation;Moist Heat;Vasopneumatic;Ultrasound     Moist Heat Therapy   Number Minutes Moist Heat 15 Minutes   Moist Heat Location Lumbar Spine     Electrical Stimulation   Electrical Stimulation Location bilat lumbar / hips    Electrical Stimulation Action IFC   Electrical Stimulation Parameters  to tolerance    Electrical Stimulation Goals Pain     Ultrasound   Ultrasound Location Lt medial knee   Ultrasound Parameters 50%, 1.0 w/cm2, 8 min    Ultrasound Goals Edema;Pain     Vasopneumatic   Number Minutes Vasopneumatic  15 minutes   Vasopnuematic Location  Knee   Vasopneumatic Pressure Low   Vasopneumatic Temperature  3*     Manual Therapy   Manual Therapy Soft tissue mobilization;Taping   Manual therapy comments Pt in prone   Soft tissue mobilization to Lt hamstring - gentle muscle stripping.     Kinesiotex Inhibit Muscle;Edema     Kinesiotix   Edema 2 octopus strips applied to Lt  medial knee    Inhibit Muscle  2- I strips placed over medial hamstring (distal with 15% stretch)                      PT Long Term Goals - 11/03/15 1708      PT LONG TERM GOAL #1   Title Improve posture and alignment with patient to demonstrate good alignment with  patient to demo good position of trunk over hips and knees with equal weight bearing 11/30/15   Time 6   Period Weeks   Status On-going     PT LONG TERM GOAL #2   Title Increase AROM bilat knees and hips to [facility] 11/30/15   Time 6   Period Weeks   Status On-going     PT LONG TERM GOAL #3   Title Decrease pain Lt knee and Rt hip allowing patient to perform normal standing and walking activities for ADL's 11/30/15   Time 6   Period Weeks   Status On-going     PT LONG TERM GOAL #4   Title I in HEP 11/30/15   Time 6   Period Weeks   Status On-going     PT LONG TERM GOAL #5   Title Improve FOTO to </= 24% limitation  11/30/15   Time 6   Period Weeks   Status On-going               Plan - 11/03/15 1703    Clinical Impression Statement Pt had incidental flare up in Lt knee when walking across parking lot.  Point tender in Lt medial/posterior knee with palpation.  Gentle stretches and modalities used to calm area down.  Trial of Rock tape for decompression of tissue, edema control, pain reduction.  No goals met at this time, only 2nd visit.    Rehab Potential Good   PT Frequency 2x / week   PT Duration 6 weeks   PT Treatment/Interventions Patient/family education;ADLs/Self Care Home Management;Cryotherapy;Electrical Stimulation;Iontophoresis 65m/ml Dexamethasone;Moist Heat;Traction;Ultrasound;Neuromuscular re-education;Dry needling;Manual techniques;Therapeutic activities;Therapeutic exercise   PT Next Visit Plan Assess response to US/ Rock tape to knee.  Retape if needed.     Consulted and Agree with Plan of Care Patient      Patient will benefit from skilled therapeutic intervention in order to improve the following deficits and impairments:  Postural dysfunction, Improper body mechanics, Pain, Decreased range of motion, Decreased mobility, Decreased strength, Increased fascial restricitons, Increased muscle spasms, Decreased activity tolerance  Visit Diagnosis: Acute pain of left knee  Muscle weakness (generalized)     Problem List Patient Active Problem List   Diagnosis Date Noted  . Primary osteoarthritis of left knee 10/05/2015  . Left lumbar radiculopathy 10/05/2015  . Left cervical radiculopathy 01/22/2015   JKerin Perna PTA 11/03/15 5:09 PM  [facility] [address] [address]Phone: [phone]  Fax:  [phone] Name: Tracy PrestMRN: [id number] of Birth: 117-Sep-1967

## 2015-11-05 ENCOUNTER — Ambulatory Visit (INDEPENDENT_AMBULATORY_CARE_PROVIDER_SITE_OTHER): Payer: BLUE CROSS/BLUE SHIELD | Admitting: Physical Therapy

## 2015-11-05 DIAGNOSIS — M25562 Pain in left knee: Secondary | ICD-10-CM

## 2015-11-05 DIAGNOSIS — R29898 Other symptoms and signs involving the musculoskeletal system: Secondary | ICD-10-CM

## 2015-11-05 DIAGNOSIS — M6281 Muscle weakness (generalized): Secondary | ICD-10-CM | POA: Diagnosis not present

## 2015-11-05 DIAGNOSIS — M25551 Pain in right hip: Secondary | ICD-10-CM

## 2015-11-05 NOTE — Therapy (Signed)
[facility] [place] [address] [place], [place], [address] Phone: [phone]   Fax:  [phone]  Physical Therapy Treatment  Patient Details  Name: Tracy [name] MRN: [id number] Date of Birth: Jul 25, 1965 Referring Provider: Dr. Briant Sites   Encounter Date: 11/05/2015      PT End of Session - 11/05/15 1542    Visit Number 3   Number of Visits 12   Date for PT Re-Evaluation 11/30/15   PT Start Time 1535   PT Stop Time 1618   PT Time Calculation (min) 43 min   Activity Tolerance Patient tolerated treatment well      No past medical history on file.  Past Surgical History:  Procedure Laterality Date  . TOE SURGERY    . WISDOM TOOTH EXTRACTION      There were no vitals filed for this visit.      Subjective Assessment - 11/05/15 1542    Subjective Pt reports her Lt knee was bothering her in the night, any time she twisted and turned.   The next day it had calmed down.  She feels the tape helped ease the pain.  If she walks too fast, she has pain in the back of knee - otherwise pain has shifted to the front of knee. Also, she held off on LE workouts, only worked UE at gym yesterday.     Currently in Pain? Yes   Pain Score 1    Pain Location Knee   Pain Orientation Left   Pain Descriptors / Indicators Dull;Aching            [facility] PT Assessment - 11/05/15 0001      Assessment   Medical Diagnosis Osteoarthritis Lt knee; Lt hip pain    Referring Provider Dr. Briant Sites    Onset Date/Surgical Date 08/17/15   Hand Dominance Left   Next MD Visit to schedule after 4 weeks of therapy            [facility] Adult PT Treatment/Exercise - 11/05/15 0001      Knee/Hip Exercises: Stretches   Passive Hamstring Stretch Left;3 reps   Lobbyist Left;Right;3 reps;30 seconds   Quad Stretch Limitations then 3 reps of contract / relax    ITB Stretch 3 reps;30 seconds  followed by adductor stretch with strap for 30 sec x 2  reps   Other Knee/Hip Stretches prone press up 2-3 sec hold x 10      Knee/Hip Exercises: Aerobic   Nustep L2: 5 min      Knee/Hip Exercises: Prone   Hip Extension Strengthening;Right;Left;2 sets;5 reps   Hip Extension Limitations then 5 reps with opp arm.      Moist Heat Therapy   Number Minutes Moist Heat 15 Minutes   Moist Heat Location Lumbar Spine     Ultrasound   Ultrasound Location Lt medial / ant and post knee    Ultrasound Parameters 50%, 1.0 w/cm2, 10 min    Ultrasound Goals Edema;Pain     Manual Therapy   Kinesiotex Create Space     Kinesiotix   Create Space decompression I strip placed over patellar tendon, another one perpendicular to that one.    Inhibit Muscle   I strip placed over Lt medial hamstring (distal with 15% stretch).                       PT Long Term Goals - 11/03/15 1708      PT LONG TERM  GOAL #1   Title Improve posture and alignment with patient to demonstrate good alignment with  patient to demo good position of trunk over hips and knees with equal weight bearing 11/30/15   Time 6   Period Weeks   Status On-going     PT LONG TERM GOAL #2   Title Increase AROM bilat knees and hips to [facility] 11/30/15   Time 6   Period Weeks   Status On-going     PT LONG TERM GOAL #3   Title Decrease pain Lt knee and Rt hip allowing patient to perform normal standing and walking activities for ADL's 11/30/15   Time 6   Period Weeks   Status On-going     PT LONG TERM GOAL #4   Title I in HEP 11/30/15   Time 6   Period Weeks   Status On-going     PT LONG TERM GOAL #5   Title Improve FOTO to </= 24% limitation 11/30/15   Time 6   Period Weeks   Status On-going               Plan - 11/05/15 1624    Clinical Impression Statement Pain in Lt knee has moved more anteriorly today, but pt still has some point tenderness and pain in semimembranosus (distal attachment). Quads remain very tight.  Continued gentle stretches and modalities  to calm area.  Positive response to Rock tape; retaped today.     Rehab Potential Good   PT Frequency 2x / week   PT Duration 6 weeks   PT Treatment/Interventions Patient/family education;ADLs/Self Care Home Management;Cryotherapy;Electrical Stimulation;Iontophoresis 4mg /ml Dexamethasone;Moist Heat;Traction;Ultrasound;Neuromuscular re-education;Dry needling;Manual techniques;Therapeutic activities;Therapeutic exercise   PT Next Visit Plan Continue progressive stretches / strengthening to Lt knee/ hip.  modalities and tape as indicated.    Consulted and Agree with Plan of Care Patient      Patient will benefit from skilled therapeutic intervention in order to improve the following deficits and impairments:  Postural dysfunction, Improper body mechanics, Pain, Decreased range of motion, Decreased mobility, Decreased strength, Increased fascial restricitons, Increased muscle spasms, Decreased activity tolerance  Visit Diagnosis: Acute pain of left knee  Muscle weakness (generalized)  Pain in right hip  Other symptoms and signs involving the musculoskeletal system     Problem List Patient Active Problem List   Diagnosis Date Noted  . Primary osteoarthritis of left knee 10/05/2015  . Left lumbar radiculopathy 10/05/2015  . Left cervical radiculopathy 01/22/2015   Mayer CamelJennifer Carlson-Long, PTA 11/05/15 4:27 PM  [facility] [place] [address] [place] [place], [place], [address] Phone: [phone]   Fax:  [phone]  Name: Tracy [name] MRN: [id number] Date of Birth: 1965/06/29

## 2015-11-09 ENCOUNTER — Encounter: Payer: Self-pay | Admitting: Sports Medicine

## 2015-11-09 ENCOUNTER — Ambulatory Visit (INDEPENDENT_AMBULATORY_CARE_PROVIDER_SITE_OTHER): Payer: BLUE CROSS/BLUE SHIELD | Admitting: Sports Medicine

## 2015-11-09 DIAGNOSIS — M1712 Unilateral primary osteoarthritis, left knee: Secondary | ICD-10-CM | POA: Diagnosis not present

## 2015-11-09 NOTE — Assessment & Plan Note (Signed)
There is significant popping and catching behind the left knee however improved significantly with injection, aspiration. Has done extremely well with physical therapy. Overall has been doing very well however had a recent episode of a catch behind the medial joint line of the left knee with severe pain. She likely has a degenerative meniscal tear, and at this point with persistent mechanical symptoms needs arthroscopic intervention. [place] so I am going to get an MRI, and she can take the disc to her orthopedic surgeon.

## 2015-11-09 NOTE — Progress Notes (Signed)
  Subjective:    CC: Follow-up  HPI: Left knee osteoarthritis: Improved significantly after aspiration and injection however still has some pain with mechanical symptoms mostly at the posterior medial joint line. At this point she understands that she will need an MRI, likely arthroscopic intervention.  Past medical history:  Negative.  See flowsheet/record as well for more information.  Surgical history: Negative.  See flowsheet/record as well for more information.  Family history: Negative.  See flowsheet/record as well for more information.  Social history: Negative.  See flowsheet/record as well for more information.  Allergies, and medications have been entered into the medical record, reviewed, and no changes needed.   Review of Systems: No fevers, chills, night sweats, weight loss, chest pain, or shortness of breath.   Objective:    General: Well Developed, well nourished, and in no acute distress.  Neuro: Alert and oriented x3, extra-ocular muscles intact, sensation grossly intact.  HEENT: Normocephalic, atraumatic, pupils equal round reactive to light, neck supple, no masses, no lymphadenopathy, thyroid nonpalpable.  Skin: Warm and dry, no rashes. Cardiac: Regular rate and rhythm, no murmurs rubs or gallops, no lower extremity edema.  Respiratory: Clear to auscultation bilaterally. Not using accessory muscles, speaking in full sentences.  Impression and Recommendations:    Primary osteoarthritis of left knee There is significant popping and catching behind the left knee however improved significantly with injection, aspiration. Has done extremely well with physical therapy. Overall has been doing very well however had a recent episode of a catch behind the medial joint line of the left knee with severe pain. She likely has a degenerative meniscal tear, and at this point with persistent mechanical symptoms needs arthroscopic intervention. [place]  so I am going to get an MRI, and she can take the disc to her orthopedic surgeon.

## 2015-11-10 ENCOUNTER — Ambulatory Visit (INDEPENDENT_AMBULATORY_CARE_PROVIDER_SITE_OTHER): Payer: BLUE CROSS/BLUE SHIELD | Admitting: Physical Therapy

## 2015-11-10 DIAGNOSIS — M25562 Pain in left knee: Secondary | ICD-10-CM

## 2015-11-10 DIAGNOSIS — M6281 Muscle weakness (generalized): Secondary | ICD-10-CM

## 2015-11-10 DIAGNOSIS — R29898 Other symptoms and signs involving the musculoskeletal system: Secondary | ICD-10-CM

## 2015-11-10 NOTE — Therapy (Signed)
[facility] [address] [place], [place], [address] Phone: [phone]   Fax:  [phone]  Physical Therapy Treatment  Patient Details  Name: Tracy [name] MRN: [id number] Date of Birth: 11-19-1965 Referring Provider: Dr. Briant Siteshekkekandem  Encounter Date: 11/10/2015      PT End of Session - 11/10/15 1608    Visit Number 4   Number of Visits 12   Date for PT Re-Evaluation 11/30/15   PT Start Time 1603   PT Stop Time 1659   PT Time Calculation (min) 56 min   Activity Tolerance Patient tolerated treatment well      No past medical history on file.  Past Surgical History:  Procedure Laterality Date  . TOE SURGERY    . WISDOM TOOTH EXTRACTION      There were no vitals filed for this visit.      Subjective Assessment - 11/10/15 1608    Subjective Pt reports she is focusing on UE with trainer.  She saw MD, he recommeded MRI.  She is awaiting scheduling of this.  She reports the [employer] Tape has helped, give support and decrease discomfort.     Currently in Pain? No/denies            [facility] - 11/10/15 0001      Assessment   Medical Diagnosis Osteoarthritis Lt knee; Lt hip pain    Referring Provider Dr. Briant Siteshekkekandem   Onset Date/Surgical Date 08/17/15   Hand Dominance Left     AROM   Right Knee Flexion 123   Left Knee Flexion 109     Flexibility   Quadriceps tight  ~90 deg Lt/Rt knee flexion   Lt knee painful with stretch          [facility] Adult PT Treatment/Exercise - 11/10/15 0001      Knee/Hip Exercises: Stretches   Passive Hamstring Stretch Left;2 reps;30 seconds   Quad Stretch Left;Right;3 reps;30 seconds   ITB Stretch Left;2 reps;30 seconds   Gastroc Stretch Right;Left;3 reps;30 seconds     Knee/Hip Exercises: Aerobic   Nustep L4: 5 min      Knee/Hip Exercises: Standing   Heel Raises Both;3 sets;10 reps  toes straight, in, out     Knee/Hip Exercises: Supine   Straight Leg  Raise with External Rotation Strengthening;Left;1 set;10 reps  5 sec eccentric     Knee/Hip Exercises: Prone   Hip Extension Strengthening;Right;Left;1 set;10 reps  with opp arm flexion     Ultrasound   Ultrasound Location Lt medial knee/ ant knee   Ultrasound Parameters 50%, 1.0 w/cm2, 8 min    Ultrasound Goals Edema;Pain     Vasopneumatic   Number Minutes Vasopneumatic  10 minutes   Vasopnuematic Location  Knee   Vasopneumatic Pressure Low   Vasopneumatic Temperature  3*                     PT Long Term Goals - 11/03/15 1708      PT LONG TERM GOAL #1   Title Improve posture and alignment with patient to demonstrate good alignment with  patient to demo good position of trunk over hips and knees with equal weight bearing 11/30/15   Time 6   Period Weeks   Status On-going     PT LONG TERM GOAL #2   Title Increase AROM bilat knees [facility] 11/30/15   Time 6   Period Weeks   Status On-going  PT LONG TERM GOAL #3   Title Decrease pain Lt knee and Rt hip allowing patient to perform normal standing and walking activities for ADL's 11/30/15   Time 6   Period Weeks   Status On-going     PT LONG TERM GOAL #4   Title I in HEP 11/30/15   Time 6   Period Weeks   Status On-going     PT LONG TERM GOAL #5   Title Improve FOTO to </= 24% limitation 11/30/15   Time 6   Period Weeks   Status On-going               Plan - 11/10/15 1630    Clinical Impression Statement Pt demonstrated improved Lt knee flexion; still lacking 14 deg compared to RLE.  She has pain with Lt quad stretch, but all other exercises were tolerated well.     Rehab Potential Good   PT Frequency 2x / week   PT Duration 6 weeks   PT Treatment/Interventions Patient/family education;ADLs/Self Care Home Management;Cryotherapy;Electrical Stimulation;Iontophoresis 4mg /ml Dexamethasone;Moist Heat;Traction;Ultrasound;Neuromuscular re-education;Dry needling;Manual  techniques;Therapeutic activities;Therapeutic exercise   PT Next Visit Plan Continue progressive stretches / strengthening to Lt knee/ hip.  modalities and tape as indicated.    Consulted and Agree with Plan of Care Patient      Patient will benefit from skilled therapeutic intervention in order to improve the following deficits and impairments:  Postural dysfunction, Improper body mechanics, Pain, Decreased range of motion, Decreased mobility, Decreased strength, Increased fascial restricitons, Increased muscle spasms, Decreased activity tolerance  Visit Diagnosis: Acute pain of left knee  Muscle weakness (generalized)  Other symptoms and signs involving the musculoskeletal system     Problem List Patient Active Problem List   Diagnosis Date Noted  . Primary osteoarthritis of left knee 10/05/2015  . Left lumbar radiculopathy 10/05/2015  . Left cervical radiculopathy 01/22/2015   Mayer Camel, PTA 11/10/15 4:55 PM  [facility] [place] [address] [place], [place], [address] Phone: [phone]   Fax:  [phone]  Name: Altair Stanko MRN: [id number] Date of Birth: 1965-10-06

## 2015-11-12 ENCOUNTER — Ambulatory Visit (INDEPENDENT_AMBULATORY_CARE_PROVIDER_SITE_OTHER): Payer: BLUE CROSS/BLUE SHIELD | Admitting: Physical Therapy

## 2015-11-12 DIAGNOSIS — M25562 Pain in left knee: Secondary | ICD-10-CM

## 2015-11-12 DIAGNOSIS — M25551 Pain in right hip: Secondary | ICD-10-CM

## 2015-11-12 DIAGNOSIS — M6281 Muscle weakness (generalized): Secondary | ICD-10-CM

## 2015-11-12 DIAGNOSIS — R29898 Other symptoms and signs involving the musculoskeletal system: Secondary | ICD-10-CM

## 2015-11-12 NOTE — Therapy (Signed)
Tracy [name] [place], [name], [address] Phone: [phone]   Fax:  [phone]  Physical Therapy [name]  Patient Details  Name: Tracy [name] MRN: [id number] Date of Birth: 06-11-1965 Referring Provider: Dr. Helane Rima   Encounter Date: 11/12/2015      PT End of Session - 11/12/15 1615    Visit Number 5   Number of Visits 12   Date for PT Re-Evaluation 11/30/15   PT Start Time 3716   PT Stop Time 1619   PT Time Calculation (min) 44 min   Activity Tolerance Patient tolerated [name] well   Behavior During Therapy [facility] for tasks assessed/performed      No past medical history on file.  Past Surgical History:  Procedure Laterality Date  . TOE SURGERY    . WISDOM TOOTH EXTRACTION      There were no vitals filed for this visit.          [name] Assessment - 11/12/15 0001      Assessment   Medical Diagnosis Osteoarthritis Lt knee; Lt hip pain    Referring Provider Dr. Helane Rima    Onset Date/Surgical Date 08/17/15   Hand Dominance Left          Tracy [name]/Exercise - 11/12/15 0001      Knee/Hip Exercises: Stretches   Passive Hamstring Stretch Left;2 reps;30 seconds   Quad Stretch Left;Right;30 seconds;2 reps   Gastroc Stretch Right;Left;3 reps;30 seconds   Other Knee/Hip Stretches prone press up 2-3 sec hold x 5      Knee/Hip Exercises: Aerobic   Nustep L4: 5 min      Knee/Hip Exercises: Standing   Heel Raises Both;3 sets;10 reps  toes straight, in, out   Step Down 1 set;Right;Left;10 reps;Hand Hold: 0;Step Height: 2"  some "tenderness" with Lt knee    Step Down Limitations Repeated 5 reps on 6" step     Knee/Hip Exercises: Sidelying   Clams 20 reps each side.      Knee/Hip Exercises: Prone   Hip Extension Strengthening;Right;Left;1 set;10 reps  with opp arm flexion     Moist Heat Therapy   Number Minutes Moist Heat 15 Minutes   Moist Heat Location  Lumbar Spine     Electrical Stimulation   Electrical Stimulation Location bilat lumbar / hips    Electrical Stimulation Action IFC   Electrical Stimulation Parameters to tolerance    Electrical Stimulation Goals Pain     Vasopneumatic   Number Minutes Vasopneumatic  15 minutes   Vasopnuematic Location  Knee  Lt   Vasopneumatic Pressure Low   Vasopneumatic Temperature  3*                     PT Long Term Goals - 11/12/15 1542      PT LONG TERM GOAL #1   Title Improve posture and alignment with patient to demonstrate good alignment with  patient to demo good position of trunk over hips and knees with equal weight bearing 11/30/15   Time 6   Period Weeks   Status Achieved     PT LONG TERM GOAL #2   Title Increase AROM bilat knees and hips to [facility] 11/30/15   Time 6   Period Weeks   Status On-going     PT LONG TERM GOAL #3   Title Decrease pain Lt knee and Rt hip allowing patient to perform normal standing and walking activities for ADL's 11/30/15  Time 6   Period Weeks   Status Achieved     PT LONG TERM GOAL #4   Title I in HEP 11/30/15   Time 6   Period Weeks   Status On-going     PT LONG TERM GOAL #5   Title Improve FOTO to </= 24% limitation 11/30/15   Time 6   Period Weeks               Plan - 11/12/15 1550    Clinical Impression Statement Pt's overall pain has reduced. She is now able to tolerate standing activities for ADLs without increase in symptoms.  She has met LTG # 2 and 4.  She has modified work outs with trainer; this has helped.  She had some discomfort in low back and hips with hip ext exercise; calmed by use of MHP/estim at end of session.    Rehab Potential Good   PT Frequency 2x / week   PT [name]/Interventions Patient/family education;ADLs/Self Care Home Management;Cryotherapy;Electrical Stimulation;Iontophoresis 45m/ml Dexamethasone;Moist Heat;Traction;Ultrasound;Neuromuscular re-education;Dry needling;Manual  techniques;Therapeutic activities;Therapeutic exercise   PT Next Visit Plan Continue progressive stretches / strengthening to Lt knee/ hip.  modalities as indicated.    Consulted and Agree with Plan of Care Patient      Patient will benefit from skilled therapeutic intervention in order to improve the following deficits and impairments:  Postural dysfunction, Improper body mechanics, Pain, Decreased range of motion, Decreased mobility, Decreased strength, Increased fascial restricitons, Increased muscle spasms, Decreased activity tolerance  Visit Diagnosis: Acute pain of left knee  Muscle weakness (generalized)  Other symptoms and signs involving the musculoskeletal system  Pain in right hip     Problem List Patient Active Problem List   Diagnosis Date Noted  . Primary osteoarthritis of left knee 10/05/2015  . Left lumbar radiculopathy 10/05/2015  . Left cervical radiculopathy 01/22/2015   Tracy [name] PTA 11/12/15 4:23 PM  Tracy [name] [id number]: [phone]  Fax:  [phone] Name: Tracy NanniMRN: [id number] of Birth: 112/21/1967

## 2015-11-15 ENCOUNTER — Ambulatory Visit (INDEPENDENT_AMBULATORY_CARE_PROVIDER_SITE_OTHER): Payer: BLUE CROSS/BLUE SHIELD

## 2015-11-15 ENCOUNTER — Ambulatory Visit (INDEPENDENT_AMBULATORY_CARE_PROVIDER_SITE_OTHER): Payer: BLUE CROSS/BLUE SHIELD | Admitting: Physical Therapy

## 2015-11-15 DIAGNOSIS — M1712 Unilateral primary osteoarthritis, left knee: Secondary | ICD-10-CM

## 2015-11-15 DIAGNOSIS — M23222 Derangement of posterior horn of medial meniscus due to old tear or injury, left knee: Secondary | ICD-10-CM

## 2015-11-15 DIAGNOSIS — M6281 Muscle weakness (generalized): Secondary | ICD-10-CM | POA: Diagnosis not present

## 2015-11-15 DIAGNOSIS — M25462 Effusion, left knee: Secondary | ICD-10-CM | POA: Diagnosis not present

## 2015-11-15 DIAGNOSIS — M2342 Loose body in knee, left knee: Secondary | ICD-10-CM | POA: Diagnosis not present

## 2015-11-15 DIAGNOSIS — R29898 Other symptoms and signs involving the musculoskeletal system: Secondary | ICD-10-CM

## 2015-11-15 DIAGNOSIS — M25562 Pain in left knee: Secondary | ICD-10-CM | POA: Diagnosis not present

## 2015-11-15 NOTE — Therapy (Signed)
[facility] [address] [place], [place], [address] Phone: [phone]   Fax:  [phone]  Physical Therapy Treatment  Patient Details  Name: Tracy [name] MRN: [id number] Date of Birth: 08/19/1965 Referring Provider: Dr. Briant Siteshekkekandem   Encounter Date: 11/15/2015      PT End of Session - 11/15/15 1605    Visit Number 6   Number of Visits 12   Date for PT Re-Evaluation 11/30/15   PT Start Time 1601   PT Stop Time 1657   PT Time Calculation (min) 56 min   Activity Tolerance Patient tolerated treatment well;No increased pain   Behavior During Therapy [facility] for tasks assessed/performed      No past medical history on file.  Past Surgical History:  Procedure Laterality Date  . TOE SURGERY    . WISDOM TOOTH EXTRACTION      There were no vitals filed for this visit.      Subjective Assessment - 11/15/15 1605    Subjective Pt has been holding off on LE workouts with trainer; focusing on UE only.  Her Lt knee bothered her Saturday night, but she attributes it to the shoes she was wearing. Her back is a little tender from laying on back for 30 min for MRI of Lt knee    How long can you stand comfortably? 30 min   How long can you walk comfortably? > 1 hour   Currently in Pain? No/denies   Pain Score 0-No pain            [facility] PT Assessment - 11/15/15 0001      Assessment   Medical Diagnosis Osteoarthritis Lt knee; Lt hip pain    Referring Provider Dr. Briant Siteshekkekandem    Onset Date/Surgical Date 08/17/15   Hand Dominance Left     ROM / Strength   AROM / PROM / Strength --  Lt/Rt hip ext 5/5     Flexibility   Hamstrings ~90 degrees bilat      Palpation   Palpation comment Pt point tender in Lt distal medial hamstring, adductor, pes anserine.           [facility] Adult PT Treatment/Exercise - 11/15/15 0001       Knee/Hip Exercises: Stretches   Passive Hamstring Stretch Left;2 reps;30 seconds   Quad Stretch Left;Right;30 seconds;3 reps   Gastroc Stretch Right;Left;3 reps;30 seconds   Soleus Stretch Right;Left;2 reps;20 seconds   Soleus Stretch Limitations --   Other Knee/Hip Stretches prone press up 2-3 sec hold x 5    Other Knee/Hip Stretches Lt adductor then ITB stretch in supine with strap x 2 reps x 30 sec      Knee/Hip Exercises: Aerobic   Nustep L5: 7 min      Knee/Hip Exercises: Machines for Strengthening   Cybex Leg Press 7 plates: 10 reps, 8 plates x 10 reps      Knee/Hip Exercises: Standing   Heel Raises 1 set;15 reps   Side Lunges Right;Left;1 set;5 reps  with hold for added stretch in groin.    Wall Squat 1 set;10 reps;5 seconds  mini squat, within tolerance    SLS standing on blue pad x 30 sec each  leg; repeated with horiz head turns      Knee/Hip Exercises: Prone   Hip Extension Strengthening;Right;Left;1 set;10 reps  with opp arm flexion     Moist Heat Therapy   Number Minutes Moist Heat 15 Minutes   Moist Heat Location Lumbar Spine     Cryotherapy   Number Minutes Cryotherapy 15 Minutes   Cryotherapy Location Knee  Lt    Type of Cryotherapy Ice pack     Electrical Stimulation   Electrical Stimulation Location bilat lumbar / hips    Electrical Stimulation Action IFC   Electrical Stimulation Parameters to tolerance    Electrical Stimulation Goals Pain                     PT Long Term Goals - 11/12/15 1542      PT LONG TERM GOAL #1   Title Improve posture and alignment with patient to demonstrate good alignment with  patient to demo good position of trunk over hips and knees with equal weight bearing 11/30/15   Time 6   Period Weeks   Status Achieved     PT LONG TERM GOAL #2   Title Increase AROM bilat knees and hips to [facility] 11/30/15   Time 6   Period Weeks   Status On-going     PT LONG TERM GOAL #3   Title Decrease pain Lt knee and Rt  hip allowing patient to perform normal standing and walking activities for ADL's 11/30/15   Time 6   Period Weeks   Status Achieved     PT LONG TERM GOAL #4   Title I in HEP 11/30/15   Time 6   Period Weeks   Status On-going     PT LONG TERM GOAL #5   Title Improve FOTO to </= 24% limitation 11/30/15   Time 6   Period Weeks               Plan - 11/15/15 1650    Clinical Impression Statement Pt has difficulty tolerating standing exercises that include knee flexion <35-45 deg, due to increased knee pain.  She continues with tight quads bilat and tenderness along medial Lt knee joint line with palpation.  Pt's knee and hip pain has decreased overall since beginning therapy.     Rehab Potential Good   PT Frequency 2x / week   PT Duration 6 weeks   PT Treatment/Interventions Patient/family education;ADLs/Self Care Home Management;Cryotherapy;Electrical Stimulation;Iontophoresis 4mg /ml Dexamethasone;Moist Heat;Traction;Ultrasound;Neuromuscular re-education;Dry needling;Manual techniques;Therapeutic activities;Therapeutic exercise   PT Next Visit Plan Continue progressive stretches / strengthening to Lt knee/ hip.  modalities as indicated.    Consulted and Agree with Plan of Care Patient      Patient will benefit from skilled therapeutic intervention in order to improve the following deficits and impairments:  Postural dysfunction, Improper body mechanics, Pain, Decreased range of motion, Decreased mobility, Decreased strength, Increased fascial restricitons, Increased muscle spasms, Decreased activity tolerance  Visit Diagnosis: Acute pain of left knee  Muscle weakness (generalized)  Other symptoms and signs involving the musculoskeletal system     Problem List Patient Active Problem List   Diagnosis Date Noted  . Primary osteoarthritis of left knee 10/05/2015  . Left lumbar radiculopathy 10/05/2015  . Left cervical radiculopathy 01/22/2015   Mayer Camel,  PTA 11/15/15 4:59 PM  [facility] [place] [address] [place], [place], [address] Phone: [phone]   Fax:  [phone]  Name: Tracy [name] MRN: [id number] Date  of Birth: 1965/05/06

## 2015-11-18 ENCOUNTER — Ambulatory Visit (INDEPENDENT_AMBULATORY_CARE_PROVIDER_SITE_OTHER): Payer: BLUE CROSS/BLUE SHIELD | Admitting: Physical Therapy

## 2015-11-18 DIAGNOSIS — M6281 Muscle weakness (generalized): Secondary | ICD-10-CM

## 2015-11-18 DIAGNOSIS — M25562 Pain in left knee: Secondary | ICD-10-CM

## 2015-11-18 DIAGNOSIS — M25551 Pain in right hip: Secondary | ICD-10-CM

## 2015-11-18 DIAGNOSIS — R29898 Other symptoms and signs involving the musculoskeletal system: Secondary | ICD-10-CM

## 2015-11-18 NOTE — Therapy (Addendum)
[place], [name], [address] Phone: [phone]   Fax:  [phone]  Physical Therapy Treatment  Patient Details  Name: Tracy [name] MRN: [id number] Date of Birth: [birth year] Referring Provider: Dr. Helane Rima   Encounter Date: 11/18/2015      PT End of Session - 11/18/15 1820    Visit Number 7   Number of Visits 12   Date for PT Re-Evaluation 11/30/15   PT Start Time 1619   PT Stop Time 1245   PT Time Calculation (min) 39 min   Activity Tolerance Patient tolerated treatment well;No increased pain   Behavior During Therapy [facility] for tasks assessed/performed      No past medical history on file.  Past Surgical History:  Procedure Laterality Date  . TOE SURGERY    . WISDOM TOOTH EXTRACTION      There were no vitals filed for this visit.      Subjective Assessment - 11/18/15 1621    Subjective MRI result indicate pt has Tracy Lt meniscus tear, going to see Orthopedic surgeon on monday 11/22/15. Knee has been grauadually getting better.    Currently in Pain? No/denies                         [facility] Adult PT Treatment/Exercise - 11/18/15 0001      Knee/Hip Exercises: Stretches   Passive Hamstring Stretch Left;2 reps;30 seconds   Quad Stretch Left;5 reps;30 seconds   ITB Stretch Left;2 reps;30 seconds   Gastroc Stretch Right;Left;3 reps;30 seconds   Other Knee/Hip Stretches prone press up 5 sec hold x 5    Other Knee/Hip Stretches --     Knee/Hip Exercises: Aerobic   Nustep L5: 7 min      Knee/Hip Exercises: Standing   Heel Raises 1 set;15 reps   Lateral Step Up Left;1 set;10 reps   Forward Step Up 1 set;Step Height: 6";10 reps   Wall Squat 1 set;10 reps;5 seconds  with ball squeeze     Knee/Hip Exercises: Sidelying   Hip ABduction Strengthening;Right;1 set;10 reps   Hip ABduction Limitations tactile cues   Clams 10 reps Lt side.      Knee/Hip Exercises: Prone    Hip Extension Strengthening;Right;Left;1 set;10 reps  with opp arm flexion     Cryotherapy   Number Minutes Cryotherapy --  pt declined                PT Education - 11/18/15 1819    Education provided Yes   Education Details Discussion of activity progression and relation to pain   Person(s) Educated Patient   Methods Explanation   Comprehension Verbalized understanding             PT Long Term Goals - 11/12/15 1542      PT LONG TERM GOAL #1   Title Improve posture and alignment with patient to demonstrate good alignment with  patient to demo good position of trunk over hips and knees with equal weight bearing 11/30/15   Time 6   Period Weeks   Status Achieved     PT LONG TERM GOAL #2   Title Increase AROM bilat knees and hips to [facility] 11/30/15   Time 6   Period Weeks   Status On-going     PT LONG TERM GOAL #3   Title Decrease pain Lt knee and Rt hip allowing patient to perform normal standing and walking activities for ADL's  11/30/15   Time 6   Period Weeks   Status Achieved     PT LONG TERM GOAL #4   Title I in HEP 11/30/15   Time 6   Period Weeks   Status On-going     PT LONG TERM GOAL #5   Title Improve FOTO to </= 24% limitation 11/30/15   Time 6   Period Weeks               Plan - 11/18/15 1821    Clinical Impression Statement Pt able to complete strengthening and stretching program without reports of increased pain. Pt is reportedly scheduled to f/u with an Orthopedic surgeon next week. Pt to contact clinic following appointment for update and to schedule more appointments if appropriate (may also have jury duty next week also).    Rehab Potential Good   PT Frequency 2x / week   PT Duration 6 weeks   PT Treatment/Interventions Patient/family education;ADLs/Self Care Home Management;Cryotherapy;Electrical Stimulation;Iontophoresis 74m/ml Dexamethasone;Moist Heat;Traction;Ultrasound;Neuromuscular re-education;Dry needling;Manual  techniques;Therapeutic activities;Therapeutic exercise   PT Next Visit Plan Continue progressive stretches / strengthening to Lt knee/ hip.  modalities as indicated.    Consulted and Agree with Plan of Care Patient      Patient will benefit from skilled therapeutic intervention in order to improve the following deficits and impairments:  Postural dysfunction, Improper body mechanics, Pain, Decreased range of motion, Decreased mobility, Decreased strength, Increased fascial restricitons, Increased muscle spasms, Decreased activity tolerance  Visit Diagnosis: Acute pain of left knee  Muscle weakness (generalized)  Other symptoms and signs involving the musculoskeletal system  Pain in right hip     Problem List Patient Active Problem List   Diagnosis Date Noted  . Primary osteoarthritis of left knee 10/05/2015  . Left lumbar radiculopathy 10/05/2015  . Left cervical radiculopathy 01/22/2015    BLinard Millers PT, CSCS 11/18/2015, 6:27 PM  [facility] [address]Phone: [phone]  Fax:  [phone] Name: ACyndra FeinbergMRN: [id number] of Birth: 1July 22, 1967  PHYSICAL THERAPY DISCHARGE SUMMARY  Visits from Start of Care: 7  Current functional level related to goals / functional outcomes: As noted above, patient's goals are partially achieved.    Remaining deficits: As noted above.   Education / Equipment: As noted above.  Plan: Patient agrees to discharge.  Patient goals were partially met. Patient is being discharged due to Tracy change in medical status.  ?????It is reported that the pt may be proceeding with surgical intervention. Will D/C from PT services at this time.       BCassell Clement PT, CLambertonPager [phone] [phone]

## 2016-06-13 ENCOUNTER — Ambulatory Visit (INDEPENDENT_AMBULATORY_CARE_PROVIDER_SITE_OTHER): Payer: BLUE CROSS/BLUE SHIELD | Admitting: Sports Medicine

## 2016-06-13 ENCOUNTER — Encounter: Payer: Self-pay | Admitting: Sports Medicine

## 2016-06-13 DIAGNOSIS — M1712 Unilateral primary osteoarthritis, left knee: Secondary | ICD-10-CM | POA: Diagnosis not present

## 2016-06-13 NOTE — Assessment & Plan Note (Signed)
Osteoarthritis, meniscal tearing, intra-articular loose bodies on MRI. She did see Dr. Earma ReadingBashore who did not offer arthroscopy. Injection previously did help significantly 7-8 months ago, repeat aspiration and injection. Viscosupplementation is not sure, eventually I would like her to get a second opinion from another orthopedic surgeon, I do think debridement and removal of loose bodies would provide her with good longer term relief.

## 2016-06-13 NOTE — Progress Notes (Signed)
  Subjective:    CC: Follow-up  HPI: Left knee pain: This pleasant 51 year old female has known osteoarthritis, she did respond somewhat well to a steroid injection 8 months ago, she still had some mechanical symptoms so we obtained an MRI that showed a degenerative meniscal tear, intra-articular loose bodies and osteoarthritis. Sent her to orthopedic surgery for consideration of arthroscopy which was not offered, she was advised to lose weight. Currently she doesn't have much pain, but she does have significant swelling, and has a trip coming up where she will have to be hiking. Symptoms are moderate, persistent.  Past medical history:  Negative.  See flowsheet/record as well for more information.  Surgical history: Negative.  See flowsheet/record as well for more information.  Family history: Negative.  See flowsheet/record as well for more information.  Social history: Negative.  See flowsheet/record as well for more information.  Allergies, and medications have been entered into the medical record, reviewed, and no changes needed.   Review of Systems: No fevers, chills, night sweats, weight loss, chest pain, or shortness of breath.   Objective:    General: Well Developed, well nourished, and in no acute distress.  Neuro: Alert and oriented x3, extra-ocular muscles intact, sensation grossly intact.  HEENT: Normocephalic, atraumatic, pupils equal round reactive to light, neck supple, no masses, no lymphadenopathy, thyroid nonpalpable.  Skin: Warm and dry, no rashes. Cardiac: Regular rate and rhythm, no murmurs rubs or gallops, no lower extremity edema.  Respiratory: Clear to auscultation bilaterally. Not using accessory muscles, speaking in full sentences. Left Knee: Visibly swollen, minimal tenderness at the medial joint line. ROM normal in flexion and extension and lower leg rotation. Ligaments with solid consistent endpoints including ACL, PCL, LCL, MCL. Negative Mcmurray's and  provocative meniscal tests. Non painful patellar compression. Patellar and quadriceps tendons unremarkable. Hamstring and quadriceps strength is normal.  Procedure: Real-time Ultrasound Guided aspiration/injection of left knee Device: GE Logiq E  Verbal informed consent obtained.  Time-out conducted.  Noted no overlying erythema, induration, or other signs of local infection.  Skin prepped in a sterile fashion.  Local anesthesia: Topical Ethyl chloride.  With sterile technique and under real time ultrasound guidance:  Aspirated 39 mL straw-colored fluid, syringe switched and 1 mL Kenalog 40, 2 mL lidocaine, 2 mL bupivacaine injected easily. Completed without difficulty  Pain immediately resolved suggesting accurate placement of the medication.  Advised to call if fevers/chills, erythema, induration, drainage, or persistent bleeding.  Images permanently stored and available for review in the ultrasound unit.  Impression: Technically successful ultrasound guided injection.  Impression and Recommendations:    Primary osteoarthritis of left knee Osteoarthritis, meniscal tearing, intra-articular loose bodies on MRI. She did see Dr. Earma ReadingBashore who did not offer arthroscopy. Injection previously did help significantly 7-8 months ago, repeat aspiration and injection. Viscosupplementation is not sure, eventually I would like her to get a second opinion from another orthopedic surgeon, I do think debridement and removal of loose bodies would provide her with good longer term relief.

## 2016-07-11 ENCOUNTER — Ambulatory Visit: Payer: BLUE CROSS/BLUE SHIELD | Admitting: Sports Medicine

## 2016-08-16 ENCOUNTER — Ambulatory Visit (INDEPENDENT_AMBULATORY_CARE_PROVIDER_SITE_OTHER): Payer: BLUE CROSS/BLUE SHIELD | Admitting: Sports Medicine

## 2016-08-16 ENCOUNTER — Encounter: Payer: Self-pay | Admitting: Sports Medicine

## 2016-08-16 ENCOUNTER — Telehealth: Payer: Self-pay | Admitting: Sports Medicine

## 2016-08-16 DIAGNOSIS — M1712 Unilateral primary osteoarthritis, left knee: Secondary | ICD-10-CM

## 2016-08-16 MED ORDER — TRAMADOL HCL 50 MG PO TABS: ORAL_TABLET | ORAL | 0 refills | Status: DC

## 2016-08-16 NOTE — Assessment & Plan Note (Signed)
MRI did show osteoarthritis, meniscal tearing, intra-articular loose bodies. Initially arthroscopy was not offered, she has failed aspiration and steroid injection with persistent pain, occasional mechanical symptoms so she is now a candidate for arthroscopy and she will return to Dr. Earma ReadingBashore. Afterwards I would like her to return here to do a series of Orthovisc.  I'm going to go ahead and work on the approval. Tramadol for breakthrough pain in the meantime.

## 2016-08-16 NOTE — Progress Notes (Signed)
  Subjective:    CC: Follow-up  HPI: Left knee swelling: MRI did show osteoarthritis, meniscal tearing, intra-articular loose bodies. At this point she's failed aspiration, steroid injection, physical therapy, NSAIDs.  Past medical history:  Negative.  See flowsheet/record as well for more information.  Surgical history: Negative.  See flowsheet/record as well for more information.  Family history: Negative.  See flowsheet/record as well for more information.  Social history: Negative.  See flowsheet/record as well for more information.  Allergies, and medications have been entered into the medical record, reviewed, and no changes needed.   Review of Systems: No fevers, chills, night sweats, weight loss, chest pain, or shortness of breath.   Objective:    General: Well Developed, well nourished, and in no acute distress.  Neuro: Alert and oriented x3, extra-ocular muscles intact, sensation grossly intact.  HEENT: Normocephalic, atraumatic, pupils equal round reactive to light, neck supple, no masses, no lymphadenopathy, thyroid nonpalpable.  Skin: Warm and dry, no rashes. Cardiac: Regular rate and rhythm, no murmurs rubs or gallops, no lower extremity edema.  Respiratory: Clear to auscultation bilaterally. Not using accessory muscles, speaking in full sentences. Left Knee: Medial joint line pain, visibly swollen, palpable fluid wave with effusion. ROM normal in flexion and extension and lower leg rotation. Ligaments with solid consistent endpoints including ACL, PCL, LCL, MCL. Negative Mcmurray's and provocative meniscal tests. Non painful patellar compression. Patellar and quadriceps tendons unremarkable. Hamstring and quadriceps strength is normal.  Impression and Recommendations:    Primary osteoarthritis of left knee MRI did show osteoarthritis, meniscal tearing, intra-articular loose bodies. Initially arthroscopy was not offered, she has failed aspiration and steroid  injection with persistent pain, occasional mechanical symptoms so she is now a candidate for arthroscopy and she will return to Dr. Earma ReadingBashore. Afterwards I would like her to return here to do a series of Orthovisc.  I'm going to go ahead and work on the approval. Tramadol for breakthrough pain in the meantime.  I spent 25 minutes with this patient, greater than 50% was face-to-face time counseling regarding the above diagnoses

## 2016-08-16 NOTE — Telephone Encounter (Signed)
Submitted for approval on Orthovisc. Awaiting confirmation.  

## 2016-08-16 NOTE — Telephone Encounter (Signed)
-----   Message from Monica Bectonhomas J Thekkekandam, MD sent at 08/16/2016 10:15 AM EDT ----- Orthovisc approval please, left knee, x-ray and MRI confirmed, failed NSAIDs, therapy, steroid injections. ___________________________________________ Ihor Austinhomas J. Benjamin Stainhekkekandam, M.D., ABFM., CAQSM. Primary Care and Sports Medicine [facility]  Adjunct Instructor of Family Medicine  [employer]

## 2016-09-21 NOTE — Telephone Encounter (Signed)
Received the following information from [employer] benefits investigation:   Orthovisc is non covered. As of 12/17/15 HA injections are considered not medically necessary by the plan. Call reference number [id number].

## 2017-09-09 IMAGING — CR DG SHOULDER 2+V*L*
3 series · 3 of 3 positions shown · non-contrast
Comparison: None.

CLINICAL DATA: Left neck pain radiating into the left shoulder.

EXAM:
LEFT SHOULDER - 2+ VIEW

[shoulder grashey]
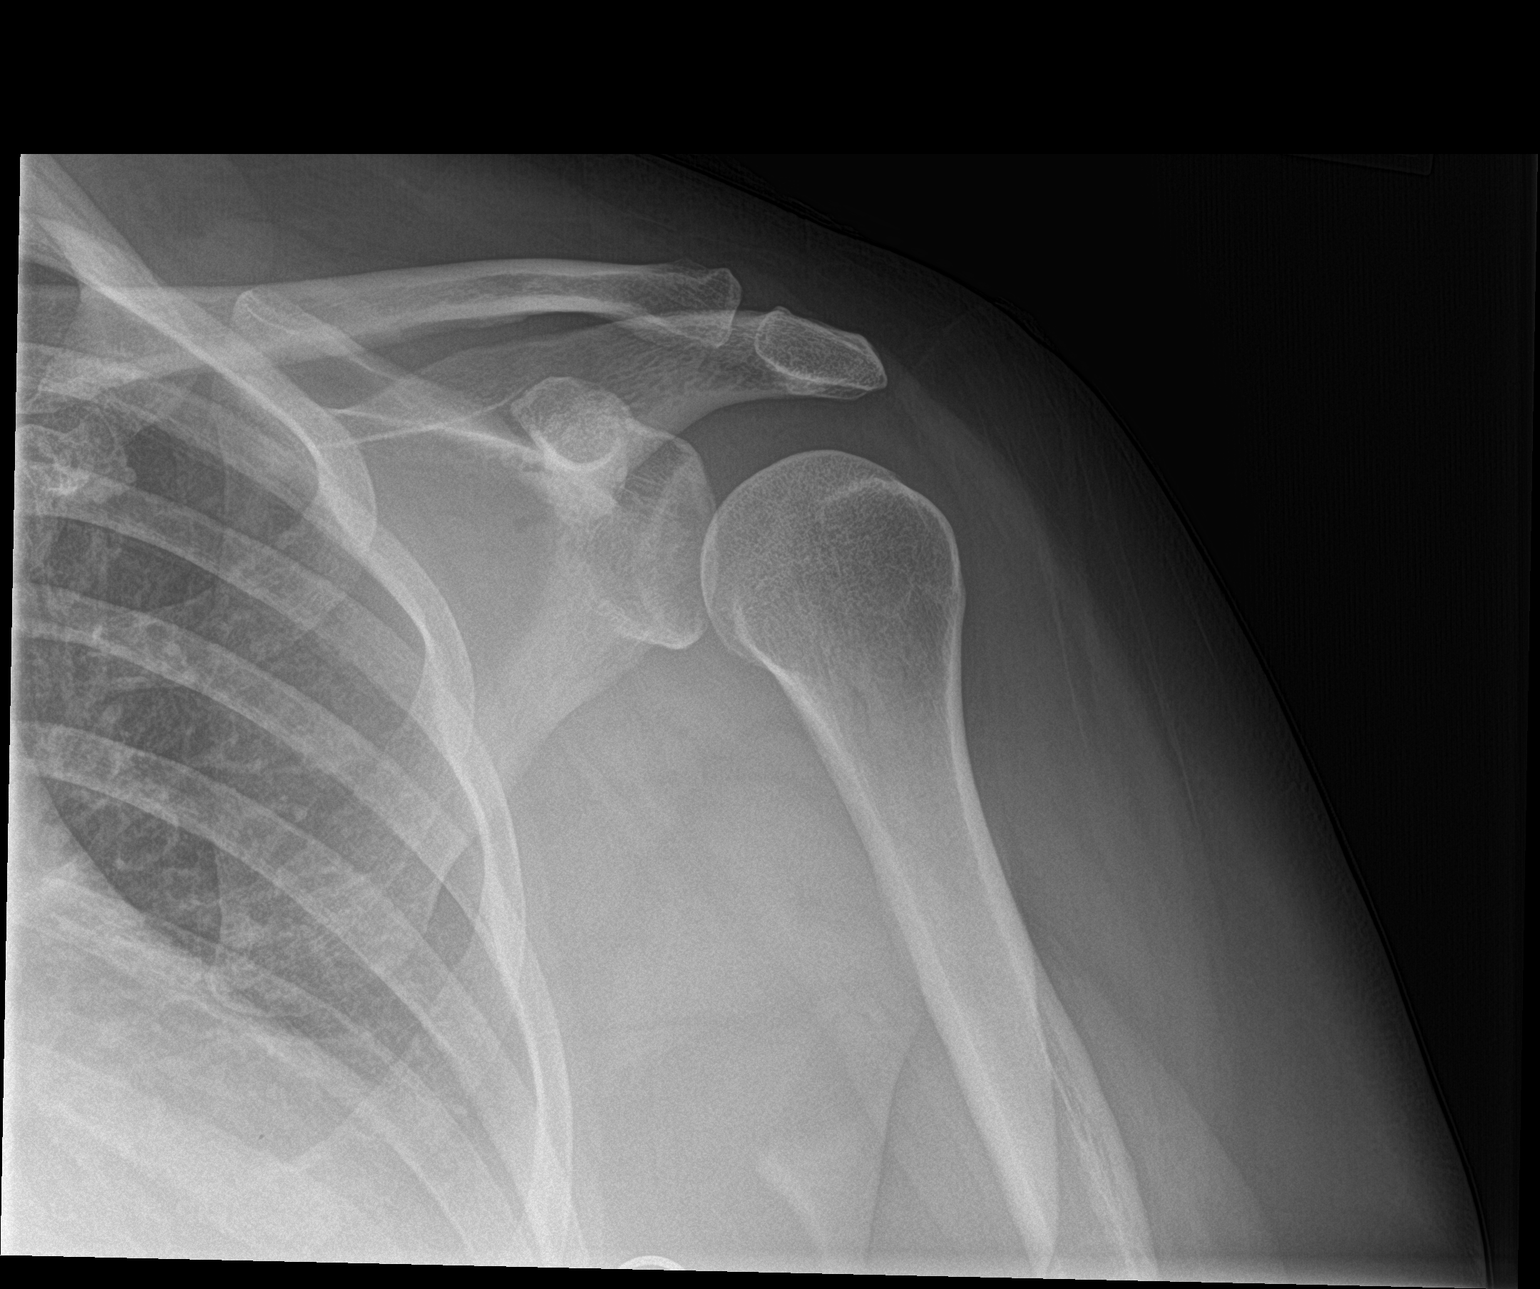

[shoulder y view]
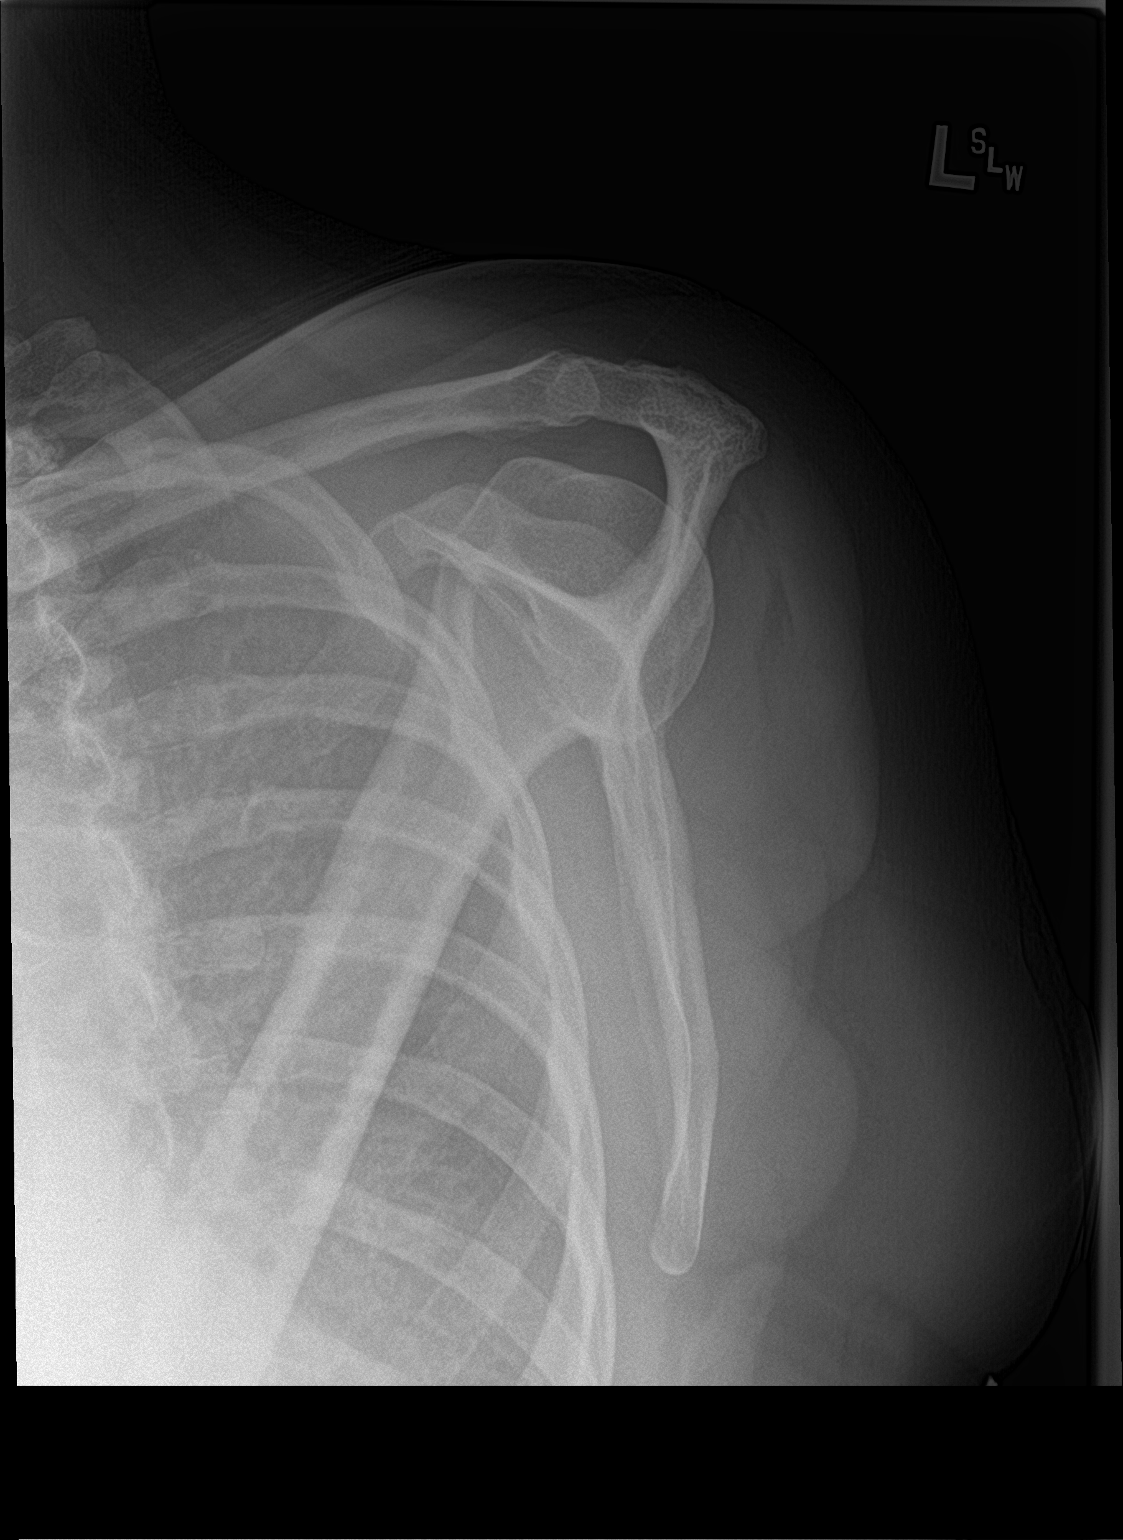

[shoulder axillary]
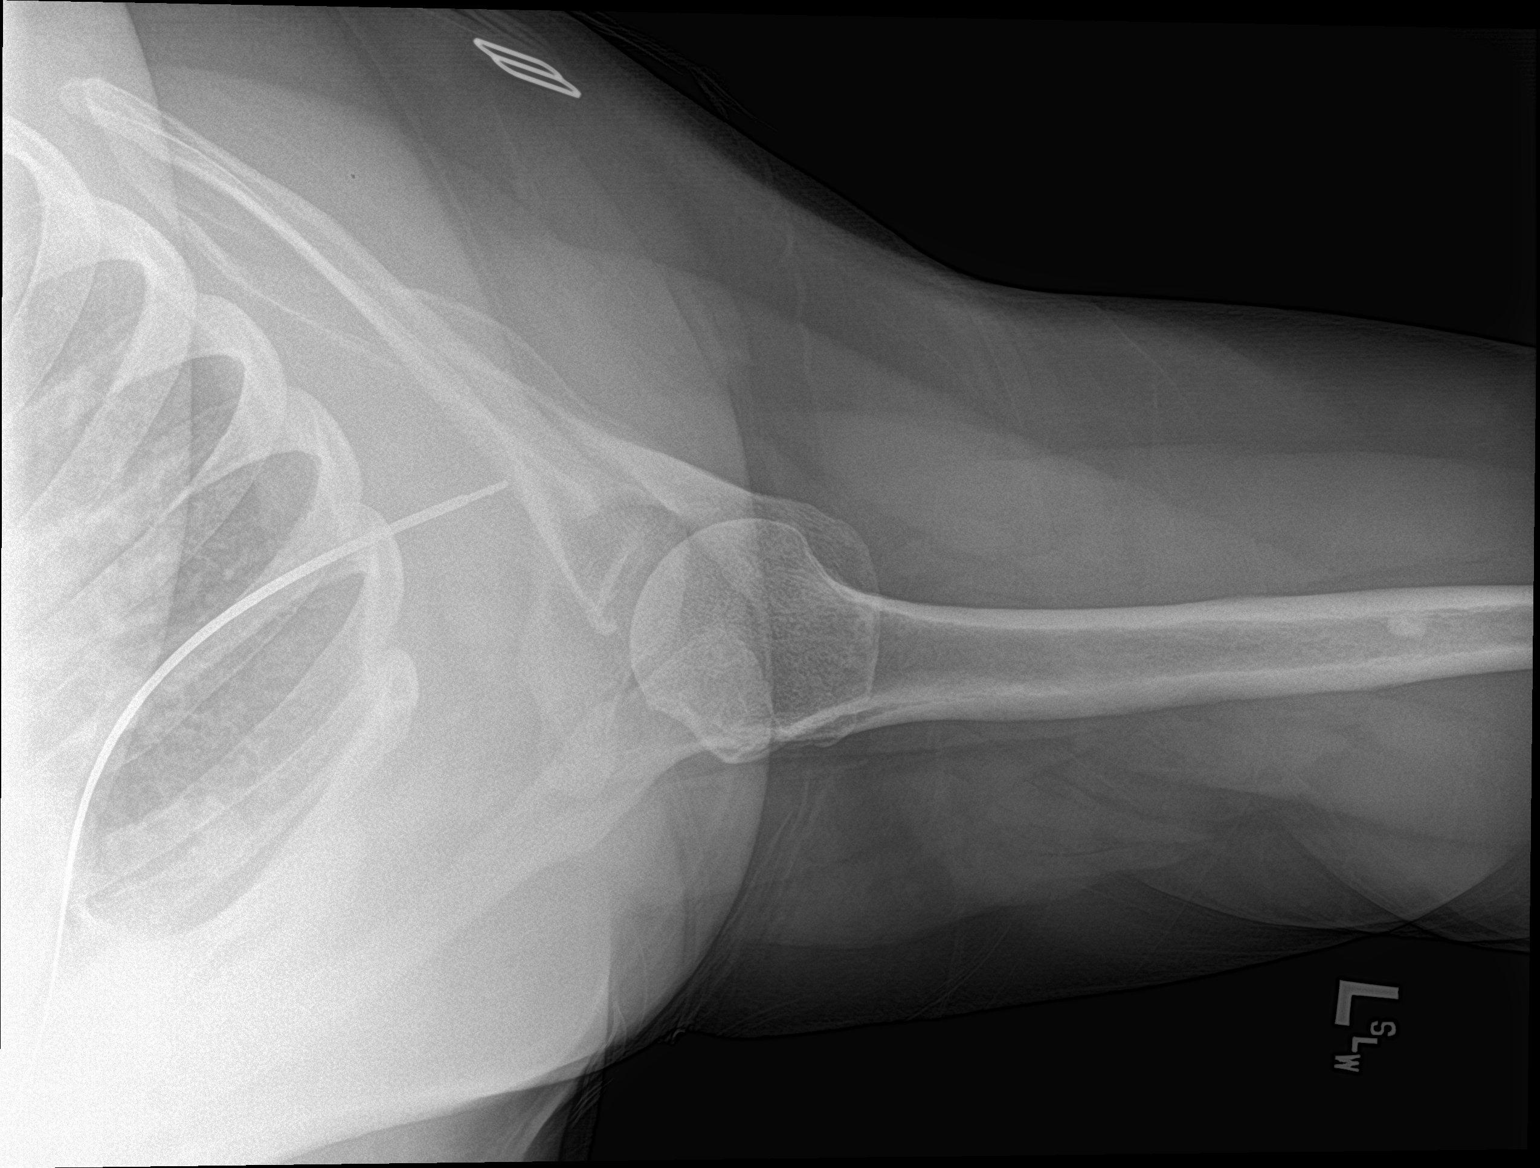

[3 of 3 positions shown; findings below may reference images not displayed]

FINDINGS: No acute fracture. No dislocation nonspecific well demarcated
sclerotic focus in the mid left humerus.
IMPRESSION: No acute bony pathology.

## 2020-07-07 ENCOUNTER — Ambulatory Visit (INDEPENDENT_AMBULATORY_CARE_PROVIDER_SITE_OTHER): Payer: BC Managed Care – PPO

## 2020-07-07 ENCOUNTER — Ambulatory Visit (INDEPENDENT_AMBULATORY_CARE_PROVIDER_SITE_OTHER): Payer: BC Managed Care – PPO | Admitting: Sports Medicine

## 2020-07-07 ENCOUNTER — Other Ambulatory Visit: Payer: Self-pay

## 2020-07-07 DIAGNOSIS — M1612 Unilateral primary osteoarthritis, left hip: Secondary | ICD-10-CM

## 2020-07-07 DIAGNOSIS — M5416 Radiculopathy, lumbar region: Secondary | ICD-10-CM | POA: Diagnosis not present

## 2020-07-07 MED ORDER — MELOXICAM 15 MG PO TABS: ORAL_TABLET | ORAL | 3 refills | Status: DC

## 2020-07-07 MED ORDER — PREDNISONE 50 MG PO TABS
ORAL_TABLET | ORAL | 0 refills | Status: DC
Start: 2020-07-07 — End: 2020-10-04

## 2020-07-07 NOTE — Assessment & Plan Note (Signed)
Pain in the anterolateral groin, gelling, catching. Pain with internal rotation on exam. Likely hip osteoarthritis, getting hip x-rays, meloxicam, PT. Injection at the follow-up visit if not better.

## 2020-07-07 NOTE — Assessment & Plan Note (Signed)
Tracy [name] returns, she is a pleasant 55 year old female, she has a couple things going on, she has some numbness and tingling in her lateral thigh, not past the knee. This is worse with sitting. I think she may have some radicular symptoms, certainly meralgia paresthetica is a possibility with reports of lateral thigh numbness and tingling that does not extend past the knee. Adding 5 days of prednisone, updated x-rays, formal PT. Return in 6 weeks, MRI for interventional planning if no better.

## 2020-07-07 NOTE — Progress Notes (Signed)
    Procedures performed today:    None.  Independent interpretation of notes and tests performed by another provider:   None.  Brief History, Exam, Impression, and Recommendations:    Left lumbar radiculopathy Tracy [name] returns, she is a pleasant 55 year old female, she has a couple things going on, she has some numbness and tingling in her lateral thigh, not past the knee. This is worse with sitting. I think she may have some radicular symptoms, certainly meralgia paresthetica is a possibility with reports of lateral thigh numbness and tingling that does not extend past the knee. Adding 5 days of prednisone, updated x-rays, formal PT. Return in 6 weeks, MRI for interventional planning if no better.  Primary osteoarthritis of left hip Pain in the anterolateral groin, gelling, catching. Pain with internal rotation on exam. Likely hip osteoarthritis, getting hip x-rays, meloxicam, PT. Injection at the follow-up visit if not better.    ___________________________________________ Ihor Austin. Benjamin Stain, M.D., ABFM., CAQSM. Primary Care and Sports Medicine [facility]  Adjunct Instructor of Family Medicine  [employer]

## 2020-07-12 ENCOUNTER — Other Ambulatory Visit: Payer: Self-pay

## 2020-07-12 ENCOUNTER — Ambulatory Visit (INDEPENDENT_AMBULATORY_CARE_PROVIDER_SITE_OTHER): Payer: BC Managed Care – PPO | Admitting: Rehabilitative and Restorative Service Providers"

## 2020-07-12 DIAGNOSIS — M5416 Radiculopathy, lumbar region: Secondary | ICD-10-CM | POA: Diagnosis not present

## 2020-07-12 DIAGNOSIS — R29898 Other symptoms and signs involving the musculoskeletal system: Secondary | ICD-10-CM | POA: Diagnosis not present

## 2020-07-12 NOTE — Therapy (Signed)
[facility] [place] [address] [place] [address] [place], [place], [address] Phone: [phone]   Fax:  [phone]  Physical Therapy Evaluation  Patient Details  Name: Tracy [name] MRN: [id number] Date of Birth: 03-12-1965 Referring Provider (PT): Dr Benjamin Stain   Encounter Date: 07/12/2020   PT End of Session - 07/12/20 1053     Visit Number 1    Number of Visits 12    Date for PT Re-Evaluation 08/23/20    PT Start Time 0851    PT Stop Time 0936    PT Time Calculation (min) 45 min    Activity Tolerance Patient tolerated treatment well             No past medical history on file.  Past Surgical History:  Procedure Laterality Date   TOE SURGERY     WISDOM TOOTH EXTRACTION      There were no vitals filed for this visit.    Subjective Assessment - 07/12/20 0854     Subjective Patient reports that she hsa had LBP for ~ 1 year. initially she had numbness in the Lt thigh with prolonged sitting or standing. She had significant increase in pain 12/21 with increased pain in back and leg and saw a chiropractor starting in January for ~ 12 visits with some improvement. She has continued pain in the LB and Lt hip/LE. She has started prednisone with some notable improvement.    Pertinent History Lt knee pain 2017; shoulder pain 2017; HTN; arthritis    Patient Stated Goals get rid of the back and leg pain    Currently in Pain? Yes    Pain Location Back    Pain Orientation Left    Pain Descriptors / Indicators Dull;Burning   lateral Lt thigh   Pain Type Chronic pain    Pain Radiating Towards lateral Lt thigh to knee    Pain Onset More than a month ago    Pain Frequency Intermittent    Aggravating Factors  prolonged standing or walking; carrying something heavy after she puts the object down    Pain Relieving Factors meds; cold                [facility] PT Assessment - 07/12/20 0001       Assessment   Medical Diagnosis Lt lumbar  radiculitis    Referring Provider (PT) Dr Benjamin Stain    Onset Date/Surgical Date 06/17/19    Hand Dominance Left    Next MD Visit 08/18/20    Prior Therapy chiropractic for LBP for several weeksin January; has seen chiropractically for 3-4 yrs;  here for Lt knee and Rt shoulder      Precautions   Precautions None      Balance Screen   Has the patient fallen in the past 6 months No    Has the patient had a decrease in activity level because of a fear of falling?  No    Is the patient reluctant to leave their home because of a fear of falling?  No      Prior Function   Level of Independence Independent    Vocation Full time employment    Vocation Requirements working from home at computer 8 hr/day    Leisure household chores; otherwise sedentary      Observation/Other Assessments   Focus on Therapeutic Outcomes (FOTO)  46      Sensation   Additional Comments numbness; burning Lt lateral thigh hip to knee on an intermittent basis  Posture/Postural Control   Posture Comments head forward; shoulders rounded; flexed forward at hips;      AROM   Lumbar Flexion 80%    Lumbar Extension 30%    Lumbar - Right Side Bend 70%    Lumbar - Left Side Bend 60% grabbing Lt LE    Lumbar - Right Rotation 30%    Lumbar - Left Rotation 30%      Strength   Overall Strength Comments WFL's bilat LE for functional activities      Flexibility   Hamstrings WFL's    Quadriceps tight Lt ~ 90 deg; Rt 100deg    ITB tight Lt    Piriformis tight Lt      Palpation   Spinal mobility WFL's with PA and lateral mobs    Palpation comment significant muscular tightness Lt piriformis and gluts      Special Tests   Other special tests (-) SLR                        Objective measurements completed on examination: See above findings.       [facility] Adult PT Treatment/Exercise - 07/12/20 0001       Therapeutic Activites    Therapeutic Activities Other Therapeutic Activities     Other Therapeutic Activities initiated spine care and poatural education      Lumbar Exercises: Stretches   Quad Stretch Left;2 reps;30 seconds   prone with strap   Piriformis Stretch Left;2 reps;30 seconds   supine travell with strap   Other Lumbar Stretch Exercise prone IR/ER Lt hip knee flexed to 90 deg 10-25 reps                    PT Education - 07/12/20 0940     Education Details HEP Posture DN office ergonomics aquatic therapy    Person(s) Educated Patient    Methods Explanation;Demonstration;Tactile cues;Verbal cues;Handout    Comprehension Verbalized understanding;Returned demonstration;Verbal cues required;Tactile cues required                 PT Long Term Goals - 07/12/20 1222       PT LONG TERM GOAL #1   Title Improve posture and alignment with patient to demonstrate good alignment with  patient to demo good position of trunk over hips and knees with equal weight bearing; core engaged for functional activities such as sit to stand; standing; lifting    Time 6    Period Weeks    Status New    Target Date 08/23/20      PT LONG TERM GOAL #2   Title Increase AROM trunk and hips to WNL's with no pain    Time 6    Period Weeks    Status New    Target Date 08/23/20      PT LONG TERM GOAL #3   Title Decrease pain in LB and radicular symptoms in Lt anterior thigh allowing patient to perform normal standing and walking activities for ADL's    Time 6    Period Weeks    Status New    Target Date 08/23/20      PT LONG TERM GOAL #4   Title Independent in HEP including aquatic exercise program for transition to home or community pool water program    Time 6    Period Weeks    Status New    Target Date 08/23/20      PT LONG TERM GOAL #  5   Title Improve functional limitation score to 59    Time 6    Period Weeks    Status New    Target Date 08/23/20                    Plan - 07/12/20 1054     Clinical Impression Statement Patient  presents with ~ 1 year history of lateral Lt thigh pain from hip to knee. She noticed onset of LBP with the lateral knee pain ~ 6 months ago and symptoms have persisted. She was seen by chiropractor ~ 12 visits with some improvement. Now on prednisone dose pack with some improvement. She has a history of intermittent LBP over the past 3-4 years treated with chiropractic care. Patient has limited trunk and Lt > Rt LE mobility and ROM; significant muscular tightness in the Lt posterior hip in piriformis and glut in area of familiar pain; radicular Lt anterior thigh pain; limited functional mobility and ADL's; pain on a daily basis. Patient will benefit from PT to address problems identified.    Stability/Clinical Decision Making Stable/Uncomplicated    Clinical Decision Making Low    Rehab Potential Good    PT Frequency 2x / week    PT Duration 6 weeks    PT Treatment/Interventions ADLs/Self Care Home Management;Aquatic Therapy;Cryotherapy;Electrical Stimulation;Iontophoresis 4mg /ml Dexamethasone;Moist Heat;Traction;Ultrasound;Gait training;Stair training;Functional mobility training;Therapeutic activities;Therapeutic exercise;Balance training;Neuromuscular re-education;Patient/family education;Manual techniques;Dry needling;Taping    PT Next Visit Plan review HEP; progress with core stabilization (3 part core; sit to stand; wall squat;etc); trial of DN/manual work Lt posterior hip - piriformis/gluts; aquatic therapy; modalities as indicated(has TENS unit at home which she will try for posterior hip)    PT Home Exercise Plan [employer]    Consulted and Agree with Plan of Care Patient             Patient will benefit from skilled therapeutic intervention in order to improve the following deficits and impairments:  Increased fascial restricitons, Obesity, Decreased activity tolerance, Pain, Hypomobility, Impaired flexibility, Improper body mechanics, Decreased mobility, Postural dysfunction  Visit  Diagnosis: Left lumbar radiculitis  Other symptoms and signs involving the musculoskeletal system     Problem List Patient Active Problem List   Diagnosis Date Noted   Primary osteoarthritis of left hip 07/07/2020   Primary osteoarthritis of left knee 10/05/2015   Left lumbar radiculopathy 10/05/2015   Left cervical radiculopathy 01/22/2015    Tracy [name] 03/22/2015 PT, MPH  07/12/2020, 12:30 PM  [facility] [address] [place], [place], [place] Phone: [phone]   Fax:  [phone]  Name: Tracy [name] MRN: [id number] Date of Birth: [birth year]

## 2020-07-12 NOTE — Patient Instructions (Addendum)
Access Code: [email or web address]: [email or web address]: 06/27/2022Prepared by: Darionna Banke HoltExercises  Prone Quadriceps Stretch with Strap - 2 x daily - 7 x weekly - 1 sets - 3 reps - 30 sec hold  Supine Piriformis Stretch with Leg Straight - 2 x daily - 7 x weekly - 1 sets - 3 reps - 30 sec hold  Prone Hip External Rotation AROM - 2 x daily - 7 x weekly - 1 sets - 15 reps - 1-2 sec hold Patient Education  Hospital doctor  Office Posture  Trigger Point Dry Needling  Aquatic Therapy: What to Expect!  Where:  [facility] [address] [place], [place] [address] [phone]           How to Prepare: Please make sure you drink 8 ounces of water about one hour prior to your pool session A caregiver must attend the entire session with the patient (unless your primary therapists feels this is not necessary). The caregiver will be responsible for assisting with dressing as well as any toileting needs.  Please arrive IN YOUR SUIT and a few minutes prior to your appointment - this helps to avoid delays in starting your session. Please make sure to attend to any toileting needs prior to entering the pool. Once on the pool deck your therapist will ask you to sign the Patient  Consent and Assignment of Benefits form. Your therapist may take your blood pressure prior to, during and after your session if indicated. We usually try and create a home exercise program based on activities we do in the pool.  Please be thinking about who might be able to assist you in the pool should you want to participate in an aquatic home exercise program at the time of discharge.  Some patients do not want to or do not have the ability to participate in an aquatic home program - this is not a barrier in any way to you participating in aquatic therapy as part of your current therapy plan!  Appointments:  All sessions are 45 minutes  About the pool: Entering the  pool Your therapist will assist you; there are two ways to enter the pool - stairs or a mechanical lift. Your therapist will determine the most appropriate way for you. Water temperature is usually around 90-91.  There is a lap pool with a temperature around 84 There may be other swimmers in the pool at the same time.   Contact Info:             To cancel appointment, please call  [facility] at [phone] If you are running late, please call [facility] at [phone]

## 2020-07-14 ENCOUNTER — Ambulatory Visit (INDEPENDENT_AMBULATORY_CARE_PROVIDER_SITE_OTHER): Payer: BC Managed Care – PPO | Admitting: Rehabilitative and Restorative Service Providers"

## 2020-07-14 ENCOUNTER — Encounter: Payer: Self-pay | Admitting: Rehabilitative and Restorative Service Providers"

## 2020-07-14 ENCOUNTER — Other Ambulatory Visit: Payer: Self-pay

## 2020-07-14 DIAGNOSIS — M5416 Radiculopathy, lumbar region: Secondary | ICD-10-CM | POA: Diagnosis not present

## 2020-07-14 DIAGNOSIS — R29898 Other symptoms and signs involving the musculoskeletal system: Secondary | ICD-10-CM | POA: Diagnosis not present

## 2020-07-14 NOTE — Therapy (Signed)
[facility] [place] [address] [place] [address] [place], [place], [address] Phone: [phone]   Fax:  [phone]  Physical Therapy Treatment  Patient Details  Name: Tracy [name] MRN: [id number] Date of Birth: 1965/04/28 Referring Provider (PT): Dr Benjamin Stain   Encounter Date: 07/14/2020   PT End of Session - 07/14/20 0932     Visit Number 2    Number of Visits 12    Date for PT Re-Evaluation 08/23/20    PT Start Time 0931    PT Stop Time 1019    PT Time Calculation (min) 48 min    Activity Tolerance Patient tolerated treatment well             History reviewed. No pertinent past medical history.  Past Surgical History:  Procedure Laterality Date   TOE SURGERY     WISDOM TOOTH EXTRACTION      There were no vitals filed for this visit.   Subjective Assessment - 07/14/20 0933     Subjective One more day on steroid dose pac. Feeling a bit better. Working on exercises at home. Trying not to cross legs or ankles when sitting at desk to work.    Currently in Pain? Yes    Pain Score 1     Pain Location Back    Pain Orientation Left    Pain Descriptors / Indicators Burning;Dull    Pain Type Chronic pain                               [facility] Adult PT Treatment/Exercise - 07/14/20 0001       Lumbar Exercises: Stretches   Prone on Elbows Stretch 1 rep;60 seconds    Press Ups 10 reps   2-3 sec pause - to pt's tolerance no pain   Quad Stretch Left;2 reps;30 seconds   prone with strap   Piriformis Stretch Left;2 reps;30 seconds   supine travell with strap   Other Lumbar Stretch Exercise prone IR/ER Lt hip knee flexed to 90 deg 10-25 reps      Lumbar Exercises: Aerobic   Nustep L5 x 5 min UE 10      Lumbar Exercises: Standing   Wall Slides 10 reps   10 sec hold VC to engage core quarter squat     Lumbar Exercises: Seated   Sit to Stand 10 reps   table elevated     Moist Heat Therapy   Number Minutes Moist  Heat 10 Minutes    Moist Heat Location Lumbar Spine;Hip   posterior hip     Manual Therapy   Manual therapy comments skilled palpation to assess response to Dn and manual work    Joint Mobilization PA mobs Lt greater trochanter    Soft tissue mobilization deep tissue work through the posterior Lt hip in piriformis and gluts    Myofascial Release posterior Lt hip              Trigger Point Dry Needling - 07/14/20 0001     Consent Given? Yes    Education Handout Provided Previously provided    Gluteus Medius Response Palpable increased muscle length    Gluteus Maximus Response Palpable increased muscle length    Piriformis Response Palpable increased muscle length                  PT Education - 07/14/20 1012     Education Details HEP  Person(s) Educated Patient    Methods Explanation;Demonstration;Tactile cues;Verbal cues;Handout    Comprehension Verbalized understanding;Returned demonstration;Verbal cues required;Tactile cues required                 PT Long Term Goals - 07/12/20 1222       PT LONG TERM GOAL #1   Title Improve posture and alignment with patient to demonstrate good alignment with  patient to demo good position of trunk over hips and knees with equal weight bearing; core engaged for functional activities such as sit to stand; standing; lifting    Time 6    Period Weeks    Status New    Target Date 08/23/20      PT LONG TERM GOAL #2   Title Increase AROM trunk and hips to WNL's with no pain    Time 6    Period Weeks    Status New    Target Date 08/23/20      PT LONG TERM GOAL #3   Title Decrease pain in LB and radicular symptoms in Lt anterior thigh allowing patient to perform normal standing and walking activities for ADL's    Time 6    Period Weeks    Status New    Target Date 08/23/20      PT LONG TERM GOAL #4   Title Independent in HEP including aquatic exercise program for transition to home or community pool water program     Time 6    Period Weeks    Status New    Target Date 08/23/20      PT LONG TERM GOAL #5   Title Improve functional limitation score to 59    Time 6    Period Weeks    Status New    Target Date 08/23/20                   Plan - 07/14/20 0936     Clinical Impression Statement Persistent intermittent pain in the Lt lateral thigh and posterior hip with significant muscular tightness noted through the piriformis and gluts. Trial of DN and manual work.    Rehab Potential Good    PT Frequency 2x / week    PT Duration 6 weeks    PT Treatment/Interventions ADLs/Self Care Home Management;Aquatic Therapy;Cryotherapy;Electrical Stimulation;Iontophoresis 4mg /ml Dexamethasone;Moist Heat;Traction;Ultrasound;Gait training;Stair training;Functional mobility training;Therapeutic activities;Therapeutic exercise;Balance training;Neuromuscular re-education;Patient/family education;Manual techniques;Dry needling;Taping    PT Next Visit Plan review HEP; progress with core stabilization (3 part core; TB including antirotation;etc d/t hx of bilat knee pain); assess response to trial of DN/manual work Lt posterior hip - piriformis/gluts; aquatic therapy; modalities as indicated(has TENS unit at home which she will try for posterior hip)    PT Home Exercise Plan [facility]    Consulted and Agree with Plan of Care Patient             Patient will benefit from skilled therapeutic intervention in order to improve the following deficits and impairments:     Visit Diagnosis: Left lumbar radiculitis  Other symptoms and signs involving the musculoskeletal system     Problem List Patient Active Problem List   Diagnosis Date Noted   Primary osteoarthritis of left hip 07/07/2020   Primary osteoarthritis of left knee 10/05/2015   Left lumbar radiculopathy 10/05/2015   Left cervical radiculopathy 01/22/2015    Tracy [name] 03/22/2015 PT, MPH  07/14/2020, 10:15 AM  [facility] [address] [place] [address] [place], [place], [place]  Phone: [phone]   Fax:  [phone]  Name: Tracy [name] MRN: [id number] Date of Birth: [birth year]

## 2020-07-14 NOTE — Patient Instructions (Signed)
Access Code: [email or web address]: [email or web address]: 06/29/2022Prepared by: Shlomo Seres HoltExercises  Prone Quadriceps Stretch with Strap - 2 x daily - 7 x weekly - 1 sets - 3 reps - 30 sec hold  Supine Piriformis Stretch with Leg Straight - 2 x daily - 7 x weekly - 1 sets - 3 reps - 30 sec hold  Prone Hip External Rotation AROM - 2 x daily - 7 x weekly - 1 sets - 15 reps - 1-2 sec hold  Wall Quarter Squat - 2 x daily - 7 x weekly - 1-2 sets - 10 reps - 5-10 sec hold  Sit to Stand - 2 x daily - 7 x weekly - 1 sets - 10 reps - 3-5 sec hold  Prone Press Up - 2 x daily - 7 x weekly - 1 sets - 10 reps - 2-3 sec hold  Standing Lumbar Extension - 2 x daily - 7 x weekly - 1 sets - 2-3 reps - 2-3 sec hold

## 2020-07-20 ENCOUNTER — Encounter: Payer: Self-pay | Admitting: Rehabilitative and Restorative Service Providers"

## 2020-07-20 ENCOUNTER — Other Ambulatory Visit: Payer: Self-pay

## 2020-07-20 ENCOUNTER — Ambulatory Visit (INDEPENDENT_AMBULATORY_CARE_PROVIDER_SITE_OTHER): Payer: BC Managed Care – PPO | Admitting: Rehabilitative and Restorative Service Providers"

## 2020-07-20 DIAGNOSIS — M5416 Radiculopathy, lumbar region: Secondary | ICD-10-CM | POA: Diagnosis not present

## 2020-07-20 DIAGNOSIS — R29898 Other symptoms and signs involving the musculoskeletal system: Secondary | ICD-10-CM | POA: Diagnosis not present

## 2020-07-20 NOTE — Therapy (Signed)
[facility] [place] [address] [place], [place], [address] Phone: [phone]   Fax:  [phone]  Physical Therapy Treatment  Patient Details  Name: Tracy [name] MRN: [id number] Date of Birth: 25-Aug-1965 Referring Provider (PT): Dr Benjamin Stain   Encounter Date: 07/20/2020   PT End of Session - 07/20/20 1151     Visit Number 3    Number of Visits 12    Date for PT Re-Evaluation 08/23/20    PT Start Time 1150    PT Stop Time 1240    PT Time Calculation (min) 50 min    Activity Tolerance Patient tolerated treatment well             History reviewed. No pertinent past medical history.  Past Surgical History:  Procedure Laterality Date   TOE SURGERY     WISDOM TOOTH EXTRACTION      There were no vitals filed for this visit.   Subjective Assessment - 07/20/20 1216     Subjective DN seemed to help. Overall doing better. Less pain. Trying to avoid crossing ankles or legs when sitting at desk for work    Currently in Pain? Yes    Pain Score 1     Pain Location Back    Pain Orientation Left    Pain Descriptors / Indicators Burning;Dull    Pain Type Chronic pain    Pain Radiating Towards lateralLt thigh to knee                [facility] PT Assessment - 07/20/20 0001       Assessment   Medical Diagnosis Lt lumbar radiculitis    Referring Provider (PT) Dr Benjamin Stain    Onset Date/Surgical Date 06/17/19    Hand Dominance Left    Next MD Visit 08/18/20    Prior Therapy chiropractic for LBP for several weeksin January; has seen chiropractically for 3-4 yrs;  here for Lt knee and Rt shoulder      Palpation   SI assessment  pain with palpation Lt Sacral border    Palpation comment significant muscular tightness Lt piriformis and gluts                           [facility] Adult PT Treatment/Exercise - 07/20/20 0001       Self-Care   Self-Care Other Self-Care Comments    Other Self-Care Comments   myofacial ball relese work standing      Lumbar Exercises: Stretches   Hip Flexor Stretch Limitations add next visit    Press Ups 10 reps   2-3 sec pause - to pt's tolerance no pain   Press Ups Limitations improved spinal extension    Piriformis Stretch Left;2 reps;30 seconds   supine travell with strap   Piriformis Stretch Limitations varing angles and repeated x 2 additional reps    Other Lumbar Stretch Exercise prone IR/ER Lt hip knee flexed to 90 deg 10-25 reps   supine windshield wiper alt knee drop x ~ 10 reps     Lumbar Exercises: Standing   Wall Slides 10 reps   10 sec hold VC to engage core quarter squat     Lumbar Exercises: Seated   Sit to Stand 10 reps   table elevated     Moist Heat Therapy   Number Minutes Moist Heat 10 Minutes    Moist Heat Location Lumbar Spine;Hip   posterior hip     Manual Therapy  Manual therapy comments skilled palpation to assess response to Dn and manual work    Joint Mobilization PA mobs Lt greater trochanter    Soft tissue mobilization deep tissue work through the posterior Lt hip in piriformis and gluts    Myofascial Release posterior Lt hip    Other Manual Therapy IASTM Lt posterior hip - gluts and piriformis              Trigger Point Dry Needling - 07/20/20 0001     Consent Given? Yes    Education Handout Provided Previously provided    Gluteus Medius Response Palpable increased muscle length    Gluteus Maximus Response Palpable increased muscle length    Piriformis Response Palpable increased muscle length                  PT Education - 07/20/20 1233     Education Details HEP    Person(s) Educated Patient    Methods Explanation;Demonstration;Tactile cues;Verbal cues;Handout    Comprehension Verbalized understanding;Returned demonstration;Verbal cues required;Tactile cues required                 PT Long Term Goals - 07/12/20 1222       PT LONG TERM GOAL #1   Title Improve posture and alignment with  patient to demonstrate good alignment with  patient to demo good position of trunk over hips and knees with equal weight bearing; core engaged for functional activities such as sit to stand; standing; lifting    Time 6    Period Weeks    Status New    Target Date 08/23/20      PT LONG TERM GOAL #2   Title Increase AROM trunk and hips to WNL's with no pain    Time 6    Period Weeks    Status New    Target Date 08/23/20      PT LONG TERM GOAL #3   Title Decrease pain in LB and radicular symptoms in Lt anterior thigh allowing patient to perform normal standing and walking activities for ADL's    Time 6    Period Weeks    Status New    Target Date 08/23/20      PT LONG TERM GOAL #4   Title Independent in HEP including aquatic exercise program for transition to home or community pool water program    Time 6    Period Weeks    Status New    Target Date 08/23/20      PT LONG TERM GOAL #5   Title Improve functional limitation score to 59    Time 6    Period Weeks    Status New    Target Date 08/23/20                   Plan - 07/20/20 1220     Clinical Impression Statement Continued pain and numbness continue in Lt lateral thigh. Persistent muscular tightness Pt posterior hip in gluts and piriformis tightness along the lateral border of sacrum. History of at least two falls landing on coccyx and/or sacrum. Patient had decreased tightness to palpation post treatment. Added myofacial release with ball and exercises.    Rehab Potential Good    PT Frequency 2x / week    PT Duration 6 weeks    PT Treatment/Interventions ADLs/Self Care Home Management;Aquatic Therapy;Cryotherapy;Electrical Stimulation;Iontophoresis 4mg /ml Dexamethasone;Moist Heat;Traction;Ultrasound;Gait training;Stair training;Functional mobility training;Therapeutic activities;Therapeutic exercise;Balance training;Neuromuscular re-education;Patient/family education;Manual techniques;Dry needling;Taping    PT  Next Visit Plan review HEP; progress with core stabilization (3 part core; TB including antirotation;etc d/t hx of bilat knee pain); continue trial of DN/manual work Lt posterior hip - piriformis/gluts; aquatic therapy; modalities as indicated(has TENS unit at home which she will try for posterior hip)    PT Home Exercise Plan [place]    Consulted and Agree with Plan of Care Patient             Patient will benefit from skilled therapeutic intervention in order to improve the following deficits and impairments:     Visit Diagnosis: Left lumbar radiculitis  Other symptoms and signs involving the musculoskeletal system     Problem List Patient Active Problem List   Diagnosis Date Noted   Primary osteoarthritis of left hip 07/07/2020   Primary osteoarthritis of left knee 10/05/2015   Left lumbar radiculopathy 10/05/2015   Left cervical radiculopathy 01/22/2015    Glen Kesinger Rober Minion PT, MPH  07/20/2020, 12:41 PM  [facility] [address] [place], [place], [address] Phone: [phone]   Fax:  [phone]  Name: Megyn Leng MRN: [id number] Date of Birth: 1965-09-08

## 2020-07-20 NOTE — Patient Instructions (Signed)
Access Code: [email or web address]: [email or web address]: 07/05/2022Prepared by: Melora Menon HoltExercises  Prone Quadriceps Stretch with Strap - 2 x daily - 7 x weekly - 1 sets - 3 reps - 30 sec hold  Supine Piriformis Stretch with Leg Straight - 2 x daily - 7 x weekly - 1 sets - 3 reps - 30 sec hold  Prone Hip External Rotation AROM - 2 x daily - 7 x weekly - 1 sets - 15 reps - 1-2 sec hold  Wall Quarter Squat - 2 x daily - 7 x weekly - 1-2 sets - 10 reps - 5-10 sec hold  Sit to Stand - 2 x daily - 7 x weekly - 1 sets - 10 reps - 3-5 sec hold  Prone Press Up - 2 x daily - 7 x weekly - 1 sets - 10 reps - 2-3 sec hold  Standing Lumbar Extension - 2 x daily - 7 x weekly - 1 sets - 2-3 reps - 2-3 sec hold  Standing Gastroc Stretch - 2 x daily - 7 x weekly - 1 sets - 3 reps - 30 sec hold  Standing Gastroc Stretch - 2 x daily - 7 x weekly - 1 sets - 3 reps - 30 sec hold  Single Leg Stance - 2 x daily - 7 x weekly - 2 sets - 5 reps - 20 sec hold  Hooklying Isometric Clamshell - 2 x daily - 7 x weekly - 1 sets - 10 reps - 3 sec hold

## 2020-07-23 ENCOUNTER — Encounter: Payer: Self-pay | Admitting: Rehabilitative and Restorative Service Providers"

## 2020-07-27 ENCOUNTER — Ambulatory Visit (INDEPENDENT_AMBULATORY_CARE_PROVIDER_SITE_OTHER): Payer: BC Managed Care – PPO | Admitting: Rehabilitative and Restorative Service Providers"

## 2020-07-27 ENCOUNTER — Encounter: Payer: Self-pay | Admitting: Rehabilitative and Restorative Service Providers"

## 2020-07-27 ENCOUNTER — Other Ambulatory Visit: Payer: Self-pay

## 2020-07-27 DIAGNOSIS — R29898 Other symptoms and signs involving the musculoskeletal system: Secondary | ICD-10-CM

## 2020-07-27 DIAGNOSIS — M5416 Radiculopathy, lumbar region: Secondary | ICD-10-CM | POA: Diagnosis not present

## 2020-07-27 NOTE — Therapy (Signed)
[facility] [place] [address] [place], [place], [address] Phone: [phone]   Fax:  [phone]  Physical Therapy Treatment  Patient Details  Name: Tracy [name] MRN: [id number] Date of Birth: 07/04/1965 Referring Provider (PT): Dr Benjamin Stain   Encounter Date: 07/27/2020   PT End of Session - 07/27/20 0845     Visit Number 4    Number of Visits 12    Date for PT Re-Evaluation 08/23/20    PT Start Time 0844    PT Stop Time 0934    PT Time Calculation (min) 50 min    Activity Tolerance Patient tolerated treatment well             History reviewed. No pertinent past medical history.  Past Surgical History:  Procedure Laterality Date   TOE SURGERY     WISDOM TOOTH EXTRACTION      There were no vitals filed for this visit.   Subjective Assessment - 07/27/20 0848     Subjective Some continued intermittent pain and numbness into the Lt posterior hip and anterior thigh. Working on avoidiing crossing legs when at computer or sitting at home. Doing exercises but not daily - does exercises maybe every other day.    Currently in Pain? Yes    Pain Score 1     Pain Location Back    Pain Orientation Left    Pain Descriptors / Indicators Dull    Pain Type Chronic pain                [facility] PT Assessment - 07/27/20 0001       Assessment   Medical Diagnosis Lt lumbar radiculitis    Referring Provider (PT) Dr Benjamin Stain    Onset Date/Surgical Date 06/17/19    Hand Dominance Left    Next MD Visit 08/18/20    Prior Therapy chiropractic for LBP for several weeksin January; has seen chiropractically for 3-4 yrs;  here for Lt knee and Rt shoulder      AROM   Lumbar Flexion 85%    Lumbar Extension 40%    Lumbar - Right Side Bend 70%    Lumbar - Left Side Bend 70% tightness no longer grabbing      Palpation   Palpation comment decreasing muscular tightness Lt piriformis and gluts                            [facility] Adult PT Treatment/Exercise - 07/27/20 0001       Lumbar Exercises: Stretches   Hip Flexor Stretch Left;Right;2 reps;20 seconds;30 seconds    Hip Flexor Stretch Limitations seated    Prone on Elbows Stretch 1 rep;60 seconds    Press Ups 10 reps   2-3 sec pause - to pt's tolerance no pain   Other Lumbar Stretch Exercise prone IR/ER Lt hip knee flexed to 90 deg 10-25 reps   supine windshield wiper alt knee drop x ~ 10 reps PT PROM     Lumbar Exercises: Aerobic   Nustep L5 x 5 min UE 10      Lumbar Exercises: Standing   Wall Slides 10 reps   10 sec hold VC to engage core quarter squat   Wall Slides Limitations holding 10# KB at chest    Scapular Retraction Strengthening;Both;15 reps;Theraband    Theraband Level (Scapular Retraction) Level 2 (Red)    Row Strengthening;Both;15 reps    Theraband Level (Row) Level 3 (Green)  Shoulder Extension Strengthening;Both;15 reps    Theraband Level (Shoulder Extension) Level 3 (Green)    Other Standing Lumbar Exercises antirotation green TB x 12 each side    Other Standing Lumbar Exercises walking single arm carry with 10# KB x 2 laps each hand      Lumbar Exercises: Seated   Sit to Stand 10 reps   table elevated   Sit to Stand Limitations 10# KB at chest      Moist Heat Therapy   Number Minutes Moist Heat 10 Minutes    Moist Heat Location Lumbar Spine;Hip   posterior hip     Manual Therapy   Joint Mobilization PA mobs Lt greater trochanter    Soft tissue mobilization deep tissue work through the posterior Lt hip in piriformis and gluts    Myofascial Release posterior Lt hip                    PT Education - 07/27/20 0911     Education Details HEP    Person(s) Educated Patient    Methods Explanation;Demonstration;Tactile cues;Verbal cues;Handout    Comprehension Verbalized understanding;Returned demonstration;Verbal cues required;Tactile cues required                 PT Long  Term Goals - 07/12/20 1222       PT LONG TERM GOAL #1   Title Improve posture and alignment with patient to demonstrate good alignment with  patient to demo good position of trunk over hips and knees with equal weight bearing; core engaged for functional activities such as sit to stand; standing; lifting    Time 6    Period Weeks    Status New    Target Date 08/23/20      PT LONG TERM GOAL #2   Title Increase AROM trunk and hips to WNL's with no pain    Time 6    Period Weeks    Status New    Target Date 08/23/20      PT LONG TERM GOAL #3   Title Decrease pain in LB and radicular symptoms in Lt anterior thigh allowing patient to perform normal standing and walking activities for ADL's    Time 6    Period Weeks    Status New    Target Date 08/23/20      PT LONG TERM GOAL #4   Title Independent in HEP including aquatic exercise program for transition to home or community pool water program    Time 6    Period Weeks    Status New    Target Date 08/23/20      PT LONG TERM GOAL #5   Title Improve functional limitation score to 59    Time 6    Period Weeks    Status New    Target Date 08/23/20                   Plan - 07/27/20 0850     Clinical Impression Statement Patient reports improvement when moving from sitting to standing and rate in which she can start walking when she stands - does not have to stand and wait before starting to walk. Gradual progress continues. Needs to be more consistent with core strengthening. Added core work with TB in standing    Rehab Potential Good    PT Frequency 2x / week    PT Duration 6 weeks    PT Treatment/Interventions ADLs/Self Care Home Management;Aquatic Therapy;Cryotherapy;Electrical Stimulation;Iontophoresis 4mg /ml  Dexamethasone;Moist Heat;Traction;Ultrasound;Gait training;Stair training;Functional mobility training;Therapeutic activities;Therapeutic exercise;Balance training;Neuromuscular re-education;Patient/family  education;Manual techniques;Dry needling;Taping    PT Next Visit Plan review HEP; progress with core stabilization (3 part core; TB including antirotation;etc d/t hx of bilat knee pain); continue trial of DN/manual work Lt posterior hip - piriformis/gluts; aquatic therapy; modalities as indicated(has TENS unit at home which she will try for posterior hip)    PT Home Exercise Plan [facility]    Consulted and Agree with Plan of Care Patient             Patient will benefit from skilled therapeutic intervention in order to improve the following deficits and impairments:     Visit Diagnosis: Left lumbar radiculitis  Other symptoms and signs involving the musculoskeletal system     Problem List Patient Active Problem List   Diagnosis Date Noted   Primary osteoarthritis of left hip 07/07/2020   Primary osteoarthritis of left knee 10/05/2015   Left lumbar radiculopathy 10/05/2015   Left cervical radiculopathy 01/22/2015    Akiera Allbaugh Rober Minion PT, MPH  07/27/2020, 9:30 AM  [facility] [address] [place], [place], [address] Phone: [phone]   Fax:  [phone]  Name: Charnette Younkin MRN: [id number] Date of Birth: 1965-04-05

## 2020-07-27 NOTE — Patient Instructions (Addendum)
  Access Code: [email or web address]: [email or web address]: 07/12/2022Prepared by: Alverta Caccamo HoltExercises  Prone Quadriceps Stretch with Strap - 2 x daily - 7 x weekly - 1 sets - 3 reps - 30 sec hold  Supine Piriformis Stretch with Leg Straight - 2 x daily - 7 x weekly - 1 sets - 3 reps - 30 sec hold  Prone Hip External Rotation AROM - 2 x daily - 7 x weekly - 1 sets - 15 reps - 1-2 sec hold  Wall Quarter Squat - 2 x daily - 7 x weekly - 1-2 sets - 10 reps - 5-10 sec hold  Sit to Stand - 2 x daily - 7 x weekly - 1 sets - 10 reps - 3-5 sec hold  Prone Press Up - 2 x daily - 7 x weekly - 1 sets - 10 reps - 2-3 sec hold  Standing Lumbar Extension - 2 x daily - 7 x weekly - 1 sets - 2-3 reps - 2-3 sec hold  Standing Gastroc Stretch - 2 x daily - 7 x weekly - 1 sets - 3 reps - 30 sec hold  Standing Gastroc Stretch - 2 x daily - 7 x weekly - 1 sets - 3 reps - 30 sec hold  Single Leg Stance - 2 x daily - 7 x weekly - 2 sets - 5 reps - 20 sec hold  Hooklying Isometric Clamshell - 2 x daily - 7 x weekly - 1 sets - 10 reps - 3 sec hold  Shoulder External Rotation and Scapular Retraction with Resistance - 2 x daily - 7 x weekly - 10 reps - 3 sets  Scapular Retraction with Resistance - 2 x daily - 7 x weekly - 10 reps - 3 sets  Scapular Retraction with Resistance Advanced - 2 x daily - 7 x weekly - 10 reps - 3 sets  Anti-Rotation Lateral Stepping with Press - 2 x daily - 7 x weekly - 1-2 sets - 10 reps - 2-3 sec hold

## 2020-07-30 ENCOUNTER — Other Ambulatory Visit: Payer: Self-pay

## 2020-07-30 ENCOUNTER — Ambulatory Visit (INDEPENDENT_AMBULATORY_CARE_PROVIDER_SITE_OTHER): Payer: BC Managed Care – PPO | Admitting: Physical Therapy

## 2020-07-30 ENCOUNTER — Encounter: Payer: Self-pay | Admitting: Physical Therapy

## 2020-07-30 DIAGNOSIS — R29898 Other symptoms and signs involving the musculoskeletal system: Secondary | ICD-10-CM | POA: Diagnosis not present

## 2020-07-30 DIAGNOSIS — M5416 Radiculopathy, lumbar region: Secondary | ICD-10-CM | POA: Diagnosis not present

## 2020-07-30 NOTE — Therapy (Signed)
[facility] [place] [address] [place] [address] [place], [place], [address] Phone: [phone]   Fax:  [phone]  Physical Therapy Treatment  Patient Details  Name: Tracy [name] MRN: [id number] Date of Birth: 02-04-1965 Referring Provider (PT): Dr Benjamin Stain   Encounter Date: 07/30/2020   PT End of Session - 07/30/20 1404     Visit Number 5    Number of Visits 12    Date for PT Re-Evaluation 08/23/20    PT Start Time 1400    PT Stop Time 1442    PT Time Calculation (min) 42 min    Activity Tolerance Patient tolerated treatment well    Behavior During Therapy [facility] for tasks assessed/performed             History reviewed. No pertinent past medical history.  Past Surgical History:  Procedure Laterality Date   TOE SURGERY     WISDOM TOOTH EXTRACTION      There were no vitals filed for this visit.   Subjective Assessment - 07/30/20 1405     Subjective Pt reports that the DN she has had in the clinic has helped.  She'd like some idea of exercises she can do in her home pool.  She reports that the seated hip flexor stretch at clinic aggrivated her Lt knee.    Pertinent History Lt knee pain 2017; shoulder pain 2017; HTN; arthritis    Patient Stated Goals get rid of the back and leg pain    Currently in Pain? Yes    Pain Score 1     Pain Location Hip    Pain Orientation Left    Pain Descriptors / Indicators Aching;Dull    Aggravating Factors  prolonged standing    Pain Relieving Factors sitting, rest.                [facility] PT Assessment - 07/30/20 0001       Assessment   Medical Diagnosis Lt lumbar radiculitis    Referring Provider (PT) Dr Benjamin Stain    Onset Date/Surgical Date 06/17/19    Hand Dominance Left    Next MD Visit 08/18/20    Prior Therapy chiropractic for LBP for several weeksin January; has seen chiropractically for 3-4 yrs;  here for Lt knee and Rt shoulder             Pt seen for aquatic therapy  today.  Treatment took place in water 3.25-4 ft in depth at the [facility] pool. Temp of water was 91.  Pt entered/exited the pool via steps independently with bilat rail.  Treatment:   Gait without floatation or UE assistance: forward, backward.    With UE support on wall:  Hip abdct x 10 each leg, Hip ext x 10 each leg. Lt hip flexor stretch (in lunge position) x 30 sec.   Hip flexion to/from hip ext x 10 each leg. Hip circles with knee flexed x  5 clockwise/ CCW each leg.  D1 flexion with LE x 5 each leg.    Without support: side step squat x 10 with arm abdct/add.  Curtsy lunges x 10.  SLS with horiz/ vertical head turns each leg (less balance on RLE).  Tandem forward gait.   With support of pool noodle:  frog hops and knee hops on diagonal (in water 4'8")  Ai Chi:  Soothing and gathering x 10. Reviewed all other Ai Chi exercises with pt verbally and visually.    Pt requires buoyancy for support and  to offload joints with strengthening exercises. Viscosity of the water is needed for resistance of strengthening; water current perturbations provides challenge to standing balance unsupported, requiring increased core activation.    PT Long Term Goals - 07/30/20 1651       PT LONG TERM GOAL #1   Title Improve posture and alignment with patient to demonstrate good alignment with  patient to demo good position of trunk over hips and knees with equal weight bearing; core engaged for functional activities such as sit to stand; standing; lifting    Time 6    Period Weeks    Status On-going      PT LONG TERM GOAL #2   Title Increase AROM trunk and hips to WNL's with no pain    Time 6    Period Weeks    Status On-going      PT LONG TERM GOAL #3   Title Decrease pain in LB and radicular symptoms in Lt anterior thigh allowing patient to perform normal standing and walking activities for ADL's    Time 6    Period Weeks    Status On-going      PT LONG TERM GOAL #4   Title  Independent in HEP including aquatic exercise program for transition to home or community pool water program    Time 6    Period Weeks    Status Achieved      PT LONG TERM GOAL #5   Title Improve functional limitation score to 59    Time 6    Period Weeks    Status On-going                   Plan - 07/30/20 1647     Clinical Impression Statement Pt informed therapist of her current exercises she completes in water; she has an extensive number of exercises she does for LE/ core/ arms.  Provided suggestions for exercises to swap in her current program for added challenge or alternative.  She reported some fatigue in her left hip during exercise, especially those on single leg.  Encouraged her to add balance exercise to her water program.  pt to schedule another visit if interested in more fine tuning of exercises for her HEP.    Rehab Potential Good    PT Frequency 2x / week    PT Duration 6 weeks    PT Treatment/Interventions ADLs/Self Care Home Management;Aquatic Therapy;Cryotherapy;Electrical Stimulation;Iontophoresis 4mg /ml Dexamethasone;Moist Heat;Traction;Ultrasound;Gait training;Stair training;Functional mobility training;Therapeutic activities;Therapeutic exercise;Balance training;Neuromuscular re-education;Patient/family education;Manual techniques;Dry needling;Taping    PT Next Visit Plan progress with core stabilization 3 part core; TB including antirotation;etc d/t hx of bilat knee pain; continue trial of DN/manual work Lt posterior hip - piriformis/gluts; aquatic therapy; modalities as indicated.    PT Home Exercise Plan [email or web address]    Consulted and Agree with Plan of Care Patient             Patient will benefit from skilled therapeutic intervention in order to improve the following deficits and impairments:  Increased fascial restricitons, Obesity, Decreased activity tolerance, Pain, Hypomobility, Impaired flexibility, Improper body mechanics, Decreased mobility,  Postural dysfunction  Visit Diagnosis: Left lumbar radiculitis  Other symptoms and signs involving the musculoskeletal system     Problem List Patient Active Problem List   Diagnosis Date Noted   Primary osteoarthritis of left hip 07/07/2020   Primary osteoarthritis of left knee 10/05/2015   Left lumbar radiculopathy 10/05/2015   Left cervical radiculopathy 01/22/2015   03/22/2015,  PTA 07/30/20 4:52 PM  [facility] [place] [address] [place], [place], [address] Phone: [phone]   Fax:  [phone]  Name: Tracy [name] MRN: [id number] Date of Birth: 1965/11/29

## 2020-08-03 ENCOUNTER — Ambulatory Visit (INDEPENDENT_AMBULATORY_CARE_PROVIDER_SITE_OTHER): Payer: BC Managed Care – PPO | Admitting: Physical Therapy

## 2020-08-03 ENCOUNTER — Other Ambulatory Visit: Payer: Self-pay

## 2020-08-03 DIAGNOSIS — M5416 Radiculopathy, lumbar region: Secondary | ICD-10-CM

## 2020-08-03 DIAGNOSIS — R29898 Other symptoms and signs involving the musculoskeletal system: Secondary | ICD-10-CM

## 2020-08-03 NOTE — Therapy (Signed)
[facility] [place] [address] [place] [address] [place], [place], [address] Phone: [phone]   Fax:  [phone]  Physical Therapy Treatment  Patient Details  Name: Tracy [name] MRN: [id number] Date of Birth: 1965-08-23 Referring Provider (PT): Dr Benjamin Stain   Encounter Date: 08/03/2020   PT End of Session - 08/03/20 1013     Visit Number 6    Number of Visits 12    Date for PT Re-Evaluation 08/23/20    PT Start Time 0930    PT Stop Time 1008    PT Time Calculation (min) 38 min    Activity Tolerance Patient tolerated treatment well    Behavior During Therapy [facility] for tasks assessed/performed             No past medical history on file.  Past Surgical History:  Procedure Laterality Date   TOE SURGERY     WISDOM TOOTH EXTRACTION      There were no vitals filed for this visit.   Subjective Assessment - 08/03/20 0935     Subjective Pt reports she felt good at aquatic therapy last week. States her pain is "always with her"    Patient Stated Goals get rid of the back and leg pain    Currently in Pain? Yes    Pain Score 1     Pain Location Hip    Pain Orientation Left    Pain Descriptors / Indicators Dull    Pain Type Chronic pain                [facility] PT Assessment - 08/03/20 0001       Assessment   Medical Diagnosis Lt lumbar radiculitis    Referring Provider (PT) Dr Benjamin Stain    Onset Date/Surgical Date 06/17/19    Hand Dominance Left    Next MD Visit 08/18/20    Prior Therapy chiropractic for LBP for several weeksin January; has seen chiropractically for 3-4 yrs;  here for Lt knee and Rt shoulder      AROM   Lumbar Flexion 90%    Lumbar - Right Side Bend [facility]    Lumbar - Left Side [facility] - catching at end range                           [facility] Adult PT Treatment/Exercise - 08/03/20 0001       Lumbar Exercises: Stretches   Press Ups 10 reps    Press Ups Limitations to tolerance     Other Lumbar Stretch Exercise prone IR/ER Lt hip knee flexed to 90 deg 10-25 reps   supine windshield wiper alt knee drop x ~ 10 reps PT PROM     Lumbar Exercises: Aerobic   Nustep L5 x 5 min UE 10      Lumbar Exercises: Standing   Wall Slides 10 reps   10 sec hold VC to engage core quarter squat   Wall Slides Limitations holding 10# KB at chest    Row Strengthening;20 reps    Theraband Level (Row) Level 3 (Green)    Shoulder Extension Strengthening;20 reps    Theraband Level (Shoulder Extension) Level 3 (Green)    Other Standing Lumbar Exercises antirotation green TB x 12 each side    Other Standing Lumbar Exercises walking single arm carry with 10# KB x 2 laps each hand      Lumbar Exercises: Seated   Sit to Stand 10 reps  Sit to Stand Limitations 10# KB      Manual Therapy   Joint Mobilization PAs to SIJ to improve mobility    Soft tissue mobilization STM bilat glutes and piriformis                         PT Long Term Goals - 07/30/20 1651       PT LONG TERM GOAL #1   Title Improve posture and alignment with patient to demonstrate good alignment with  patient to demo good position of trunk over hips and knees with equal weight bearing; core engaged for functional activities such as sit to stand; standing; lifting    Time 6    Period Weeks    Status On-going      PT LONG TERM GOAL #2   Title Increase AROM trunk and hips to WNL's with no pain    Time 6    Period Weeks    Status On-going      PT LONG TERM GOAL #3   Title Decrease pain in LB and radicular symptoms in Lt anterior thigh allowing patient to perform normal standing and walking activities for ADL's    Time 6    Period Weeks    Status On-going      PT LONG TERM GOAL #4   Title Independent in HEP including aquatic exercise program for transition to home or community pool water program    Time 6    Period Weeks    Status Achieved      PT LONG TERM GOAL #5   Title Improve functional  limitation score to 59    Time 6    Period Weeks    Status On-going                   Plan - 08/03/20 1013     Clinical Impression Statement Pt demos improved lumbar mobility and flexibility, main complaint is still a "dull" pain that is "always there".  Pt too good tolerance to core strengthening and endurance exercises    PT Next Visit Plan progress core stabilization, maual and modalities as needed    PT Home Exercise Plan [facility]    Consulted and Agree with Plan of Care Patient             Patient will benefit from skilled therapeutic intervention in order to improve the following deficits and impairments:     Visit Diagnosis: Left lumbar radiculitis  Other symptoms and signs involving the musculoskeletal system     Problem List Patient Active Problem List   Diagnosis Date Noted   Primary osteoarthritis of left hip 07/07/2020   Primary osteoarthritis of left knee 10/05/2015   Left lumbar radiculopathy 10/05/2015   Left cervical radiculopathy 01/22/2015   Tracy [name], PT  Tracy [name] 08/03/2020, 10:15 AM  [facility] [address] [place], [place], [address] Phone: [phone]   Fax:  [phone]  Name: Tracy [name] MRN: [id number] Date of Birth: 1965-03-29

## 2020-08-06 ENCOUNTER — Ambulatory Visit (INDEPENDENT_AMBULATORY_CARE_PROVIDER_SITE_OTHER): Payer: BC Managed Care – PPO | Admitting: Physical Therapy

## 2020-08-06 ENCOUNTER — Encounter: Payer: Self-pay | Admitting: Physical Therapy

## 2020-08-06 ENCOUNTER — Other Ambulatory Visit: Payer: Self-pay

## 2020-08-06 DIAGNOSIS — R29898 Other symptoms and signs involving the musculoskeletal system: Secondary | ICD-10-CM | POA: Diagnosis not present

## 2020-08-06 DIAGNOSIS — M5416 Radiculopathy, lumbar region: Secondary | ICD-10-CM | POA: Diagnosis not present

## 2020-08-06 NOTE — Therapy (Addendum)
[place], [name], [address] Phone: [phone]   Fax:  [phone]  Physical Therapy Treatment  Patient Details  Name: Tracy [name] MRN: [id number] Date of Birth: 02-23-1965 Referring Provider (PT): Dr Dianah Field   Encounter Date: 08/06/2020   PT End of Session - 08/06/20 0850     Visit Number 7    Number of Visits 12    Date for PT Re-Evaluation 08/23/20    PT Start Time 0845    PT Stop Time 0927    PT Time Calculation (min) 42 min    Activity Tolerance Patient tolerated treatment well    Behavior During Therapy [facility] for tasks assessed/performed             History reviewed. No pertinent past medical history.  Past Surgical History:  Procedure Laterality Date   TOE SURGERY     WISDOM TOOTH EXTRACTION      There were no vitals filed for this visit.   Subjective Assessment - 08/06/20 0848     Subjective She reports she does her land exercises sporadically, when she thinks about it.  "I need to get myself a routine, where I'm more consistent".  She reports she does pool exercises 2-3x/wk.  She states that the manual work during last session (to her Rt glute/hip) helped loosen her Lt hip.    Pertinent History Lt knee pain 2017; shoulder pain 2017; HTN; arthritis    Patient Stated Goals get rid of the back and leg pain    Currently in Pain? Yes    Pain Score 1     Pain Descriptors / Indicators Dull;Aching    Aggravating Factors  prolonged standing (causes increased symptoms into thigh/knee)    Pain Relieving Factors sitting, relaxing.                [facility] PT Assessment - 08/06/20 0001       Assessment   Medical Diagnosis Lt lumbar radiculitis    Referring Provider (PT) Dr Dianah Field    Onset Date/Surgical Date 06/17/19    Hand Dominance Left    Next MD Visit 08/18/20    Prior Therapy chiropractic for LBP for several weeksin January; has seen chiropractically for 3-4 yrs;   here for Lt knee and Rt shoulder              [facility] Adult PT Treatment/Exercise - 08/06/20 0001       Lumbar Exercises: Stretches   Press Ups 5 reps;5 seconds    Piriformis Stretch Right;Left;2 reps;30 seconds   modified pigeon pose.   Piriformis Stretch Limitations tolerated well; added to HEP.    Other Lumbar Stretch Exercise prone IR/ER Lt hip knee flexed to 90 deg 10 reps      Lumbar Exercises: Aerobic   Tread Mill 1.7 mph x 5 min for warm up.    Other Aerobic Exercise single laps around gym in between exercises to assess response.      Lumbar Exercises: Standing   Row Strengthening;Right;Left;10 reps    Theraband Level (Row) Level 3 (Green)   bow and arrow   Shoulder Extension Strengthening;10 reps;Theraband   3 sec pause with arms pulled down at side, core engaged.   Theraband Level (Shoulder Extension) Level 3 (Green)    Other Standing Lumbar Exercises anti-rotation with red band, 5 reps of 5 pressed out from core after side step. cues for soft knees and upright posture with press out (vs forward trunk  flexion)      Lumbar Exercises: Seated   Sit to Stand 10 reps   eccentric lowering     Lumbar Exercises: Sidelying   Clam Right;Left;15 reps      Manual Therapy   Soft tissue mobilization STM bilat glutes and piriformis               PT Long Term Goals - 08/06/20 0912       PT LONG TERM GOAL #1   Title Improve posture and alignment with patient to demonstrate good alignment with  patient to demo good position of trunk over hips and knees with equal weight bearing; core engaged for functional activities such as sit to stand; standing; lifting    Baseline pt requiring less cues and demo more equal WB in LE.    Time 6    Period Weeks    Status Partially Met      PT LONG TERM GOAL #2   Title Increase AROM trunk and hips to WNL's with no pain    Time 6    Period Weeks    Status On-going      PT LONG TERM GOAL #3   Title Decrease pain in LB and radicular  symptoms in Lt anterior thigh allowing patient to perform normal standing and walking activities for ADL's    Baseline less radicular symptoms in LE (can tolerate 30 min)    Time 6    Period Weeks    Status Partially Met      PT LONG TERM GOAL #4   Title Independent in HEP including aquatic exercise program for transition to home or community pool water program    Time 6    Period Weeks    Status Achieved      PT LONG TERM GOAL #5   Title Improve functional limitation score to 59    Time 6    Period Weeks    Status On-going                   Plan - 08/06/20 3762     Clinical Impression Statement Pt reporting she is no longer having grabbing sensation after transitional movements and she is no longer having pain in low back at night; improved from starting therapy. She has met her FOTO goal.  She tolerated all exercises well, however reported some cramping/grabbing in Lt hip at end of 5 min on treadmill. This eased up after piriformis stretches. Progressing towards LTGs.    Rehab Potential Good    PT Frequency 2x / week    PT Duration 6 weeks    PT Treatment/Interventions ADLs/Self Care Home Management;Aquatic Therapy;Cryotherapy;Electrical Stimulation;Iontophoresis 44m/ml Dexamethasone;Moist Heat;Traction;Ultrasound;Gait training;Stair training;Functional mobility training;Therapeutic activities;Therapeutic exercise;Balance training;Neuromuscular re-education;Patient/family education;Manual techniques;Dry needling;Taping    PT Next Visit Plan progress core stabilization, maual and modalities as needed. (MD note for 8/3)   PT [facility]   Consulted and Agree with Plan of Care Patient             Patient will benefit from skilled therapeutic intervention in order to improve the following deficits and impairments:  Increased fascial restricitons, Obesity, Decreased activity tolerance, Pain, Hypomobility, Impaired flexibility, Improper body mechanics,  Decreased mobility, Postural dysfunction  Visit Diagnosis: Left lumbar radiculitis  Other symptoms and signs involving the musculoskeletal system     Problem List Patient Active Problem List   Diagnosis Date Noted   Primary osteoarthritis of left hip 07/07/2020   Primary osteoarthritis  of left knee 10/05/2015   Left lumbar radiculopathy 10/05/2015   Left cervical radiculopathy 01/22/2015   Kerin Perna, PTA 08/06/20 11:53 AM   [place], [name], [address] Phone: [phone]   Fax:  [phone]  Name: Tracy [name] MRN: [id number] Date of Birth: June 29, 1965

## 2020-08-10 ENCOUNTER — Encounter: Payer: BC Managed Care – PPO | Admitting: Rehabilitative and Restorative Service Providers"

## 2020-08-13 ENCOUNTER — Encounter: Payer: Self-pay | Admitting: Rehabilitative and Restorative Service Providers"

## 2020-08-13 ENCOUNTER — Other Ambulatory Visit: Payer: Self-pay

## 2020-08-13 ENCOUNTER — Ambulatory Visit (INDEPENDENT_AMBULATORY_CARE_PROVIDER_SITE_OTHER): Payer: BC Managed Care – PPO | Admitting: Rehabilitative and Restorative Service Providers"

## 2020-08-13 DIAGNOSIS — R29898 Other symptoms and signs involving the musculoskeletal system: Secondary | ICD-10-CM

## 2020-08-13 DIAGNOSIS — M5416 Radiculopathy, lumbar region: Secondary | ICD-10-CM

## 2020-08-13 NOTE — Patient Instructions (Signed)
Access Code: [facility] URL: [email or web address] Date: 08/13/2020 Prepared by: Corlis Leak  Exercises Prone Quadriceps Stretch with Strap - 2 x daily - 7 x weekly - 1 sets - 3 reps - 30 sec hold Supine Piriformis Stretch with Leg Straight - 2 x daily - 7 x weekly - 1 sets - 3 reps - 30 sec hold Prone Hip External Rotation AROM - 2 x daily - 7 x weekly - 1 sets - 15 reps - 1-2 sec hold Sit to Stand - 2 x daily - 7 x weekly - 1 sets - 10 reps - 3-5 sec hold Prone Press Up - 2 x daily - 7 x weekly - 1 sets - 10 reps - 2-3 sec hold Standing Lumbar Extension - 2 x daily - 7 x weekly - 1 sets - 2-3 reps - 2-3 sec hold Standing Gastroc Stretch - 2 x daily - 7 x weekly - 1 sets - 3 reps - 30 sec hold Single Leg Stance - 2 x daily - 7 x weekly - 2 sets - 5 reps - 20 sec hold Hooklying Isometric Clamshell - 2 x daily - 7 x weekly - 1 sets - 10 reps - 3 sec hold Shoulder External Rotation and Scapular Retraction with Resistance - 2 x daily - 7 x weekly - 10 reps - 3 sets Scapular Retraction with Resistance - 2 x daily - 7 x weekly - 10 reps - 3 sets Scapular Retraction with Resistance Advanced - 2 x daily - 7 x weekly - 10 reps - 3 sets Anti-Rotation Lateral Stepping with Press - 2 x daily - 7 x weekly - 1-2 sets - 10 reps - 2-3 sec hold Seated Table Piriformis Stretch - 1 x daily - 7 x weekly - 1 sets - 2-3 reps - 20-30 seconds hold Standing Shoulder Row Reactive Isometric - 2 x daily - 7 x weekly - 2-3 sets - 10 reps - 3-5 sec hold Shoulder Extension Reactive Isometrics with Elbow Extended - 2 x daily - 7 x weekly - 2-3 sets - 10 reps - 3-5 sec hold

## 2020-08-13 NOTE — Therapy (Addendum)
Tracy [name], [name], [address] Phone: [phone]   Fax:  [phone]  Physical Therapy Treatment  Patient Details  Name: Tracy [name] MRN: [id number] Date of Birth: [birth year] Referring Provider (PT): Dr Dianah Field   Encounter Date: 08/13/2020   PT End of Session - 08/13/20 0805     Visit Number 8    Number of Visits 12    Date for PT Re-Evaluation 08/23/20    PT Start Time 0800    PT Stop Time 0858    PT Time Calculation (min) 58 min    Activity Tolerance Patient tolerated treatment well             History reviewed. No pertinent past medical history.  Past Surgical History:  Procedure Laterality Date   TOE SURGERY     WISDOM TOOTH EXTRACTION      There were no vitals filed for this visit.   Subjective Assessment - 08/13/20 0805     Subjective Has not een consistent with land based or water exercises this week. Feels more achy all over - knees, back, thigh numbness.    Currently in Pain? Yes    Pain Score 1     Pain Location Hip    Pain Orientation Left;Right    Pain Descriptors / Indicators Dull;Aching    Pain Type Chronic pain                [facility] PT Assessment - 08/13/20 0001       Assessment   Medical Diagnosis Lt lumbar radiculitis    Referring Provider (PT) Dr Dianah Field    Onset Date/Surgical Date 06/17/19    Hand Dominance Left    Next MD Visit to schedule    Prior Therapy chiropractic for LBP for several weeksin January; has seen chiropractically for 3-4 yrs;  here for Lt knee and Rt shoulder      AROM   Lumbar Flexion 90%    Lumbar Extension 50%    Lumbar - Right Side Bend WFL    Lumbar - Left Side Bend WFL - catching at end range    Lumbar - Right Rotation 40%    Lumbar - Left Rotation 40%      Strength   Overall Strength Comments WFL's bilat LE for functional activities - knee pain limits exercise      Flexibility   Hamstrings WFL's     Quadriceps tight Lt ~ 90 deg; Rt 100deg    ITB tight Lt    Piriformis tight Lt      Palpation   Palpation comment continued muscular tightness Lt/Rt piriformis and gluts                           [facility] Adult PT Treatment/Exercise - 08/13/20 0001       Lumbar Exercises: Stretches   Prone on Elbows Stretch 1 rep;60 seconds    Press Ups 5 reps;5 seconds      Lumbar Exercises: Aerobic   Nustep L6 x 6 min UE 10      Lumbar Exercises: Standing   Wall Slides Limitations knee pain    Row Strengthening;Right;Left;10 reps    Theraband Level (Row) Level 4 (Blue)    Row Limitations added step back 3 sec hold reactive isometric x 10    Shoulder Extension Strengthening;10 reps;Theraband    Theraband Level (Shoulder Extension) Level 4 (Blue)    Shoulder Extension Limitations  added step back 3 sec hold reactive isometric x 10    Other Standing Lumbar Exercises walking single arm carry with 10# KB x 2 laps each hand      Lumbar Exercises: Seated   Sit to Stand 20 reps    Sit to Stand Limitations 10# KB push out from chest      Moist Heat Therapy   Number Minutes Moist Heat 10 Minutes    Moist Heat Location Lumbar Spine;Hip   bilat hips     Manual Therapy   Joint Mobilization PA mobs posterior GT    Soft tissue mobilization deep tissue work/IASTM  bilat glutes and piriformis    Myofascial Release posterior Lt hip                    PT Education - 08/13/20 0829     Education Details HEP    Person(s) Educated Patient    Methods Explanation;Demonstration;Tactile cues;Verbal cues;Handout    Comprehension Verbalized understanding;Returned demonstration;Verbal cues required;Tactile cues required                 PT Long Term Goals - 08/06/20 0912       PT LONG TERM GOAL #1   Title Improve posture and alignment with patient to demonstrate good alignment with  patient to demo good position of trunk over hips and knees with equal weight bearing; core  engaged for functional activities such as sit to stand; standing; lifting    Baseline pt requiring less cues and demo more equal WB in LE.    Time 6    Period Weeks    Status Partially Met      PT LONG TERM GOAL #2   Title Increase AROM trunk and hips to WNL's with no pain    Time 6    Period Weeks    Status On-going      PT LONG TERM GOAL #3   Title Decrease pain in LB and radicular symptoms in Lt anterior thigh allowing patient to perform normal standing and walking activities for ADL's    Baseline less radicular symptoms in LE (can tolerate 30 min)    Time 6    Period Weeks    Status Partially Met      PT LONG TERM GOAL #4   Title Independent in HEP including aquatic exercise program for transition to home or community pool water program    Time 6    Period Weeks    Status Achieved      PT LONG TERM GOAL #5   Title Improve functional limitation score to 59    Time 6    Period Weeks    Status On-going                   Plan - 08/13/20 0810     Clinical Impression Statement Continued pain and muscular tightness now Rt > Lt posterior hip/buttock. Patient is inconsistent with her exercise program. Rt knee pain limits core stabilization exercise options. Progressing gradually toward stated goals of therapy. Patient pleased with progress and HEP and will continue with independent home management.    Rehab Potential Good    PT Frequency 2x / week    PT Duration 6 weeks    PT Treatment/Interventions ADLs/Self Care Home Management;Aquatic Therapy;Cryotherapy;Electrical Stimulation;Iontophoresis 48m/ml Dexamethasone;Moist Heat;Traction;Ultrasound;Gait training;Stair training;Functional mobility training;Therapeutic activities;Therapeutic exercise;Balance training;Neuromuscular re-education;Patient/family education;Manual techniques;Dry needling;Taping    PT Next Visit Plan patient will continue with independent HEP  PT Home Exercise Plan [facility]              Patient will benefit from skilled therapeutic intervention in order to improve the following deficits and impairments:     Visit Diagnosis: Left lumbar radiculitis  Other symptoms and signs involving the musculoskeletal system     Problem List Patient Active Problem List   Diagnosis Date Noted   Primary osteoarthritis of left hip 07/07/2020   Primary osteoarthritis of left knee 10/05/2015   Left lumbar radiculopathy 10/05/2015   Left cervical radiculopathy 01/22/2015    Quenna Doepke Nilda Simmer PT, MPH  08/13/2020, 8:51 AM  [facility] [place], [name], [address] Phone: [phone]   Fax:  [phone]  Name: Lula Kolton MRN: [id number] Date of Birth: 1965-02-03  PHYSICAL THERAPY DISCHARGE SUMMARY  Visits from Start of Care: 8  Current functional level related to goals / functional outcomes: See last progress note for discharge status    Remaining deficits: Intermittent pain    Education / Equipment: HEP   Patient agrees to discharge. Patient goals were met. Patient is being discharged due to meeting the stated rehab goals.  Edelin Fryer P. Helene Kelp PT, MPH 09/08/20 12:42 PM

## 2020-08-18 ENCOUNTER — Ambulatory Visit: Payer: BC Managed Care – PPO | Admitting: Sports Medicine

## 2020-10-04 ENCOUNTER — Other Ambulatory Visit: Payer: Self-pay

## 2020-10-04 ENCOUNTER — Emergency Department (INDEPENDENT_AMBULATORY_CARE_PROVIDER_SITE_OTHER)
Admission: RE | Admit: 2020-10-04 | Discharge: 2020-10-04 | Disposition: A | Discharge status: Home / Self Care | Payer: BC Managed Care – PPO | Source: Ambulatory Visit

## 2020-10-04 ENCOUNTER — Telehealth: Payer: Self-pay

## 2020-10-04 VITALS — BP 133/85 | HR 79 | Temp 99.0°F | Resp 20 | Ht 67.0 in | Wt 290.0 lb

## 2020-10-04 DIAGNOSIS — M5416 Radiculopathy, lumbar region: Secondary | ICD-10-CM

## 2020-10-04 DIAGNOSIS — M62830 Muscle spasm of back: Secondary | ICD-10-CM | POA: Diagnosis not present

## 2020-10-04 DIAGNOSIS — S39012A Strain of muscle, fascia and tendon of lower back, initial encounter: Secondary | ICD-10-CM

## 2020-10-04 HISTORY — DX: Essential (primary) hypertension: I10

## 2020-10-04 MED ORDER — METHYLPREDNISOLONE SODIUM SUCC 125 MG IJ SOLR
125.0000 mg | Freq: Once | INTRAMUSCULAR | Status: AC
  Administered 2020-10-04: 125 mg via INTRAMUSCULAR

## 2020-10-04 MED ORDER — BACLOFEN 10 MG PO TABS: 10.0000 mg | ORAL_TABLET | Freq: Three times a day (TID) | ORAL | 0 refills | Status: DC

## 2020-10-04 MED ORDER — PREDNISONE 20 MG PO TABS: ORAL_TABLET | ORAL | 0 refills | Status: DC

## 2020-10-04 NOTE — Telephone Encounter (Signed)
error 

## 2020-10-04 NOTE — ED Triage Notes (Signed)
Pt presents to Urgent Care with c/o lower back pain x 2 days. Reports that her "muscled seized up" briefly after walking up a hill two days ago and she has been in pain since then.

## 2020-10-04 NOTE — Telephone Encounter (Signed)
Cannot make a diagnosis over email, get her scheduled with either me or one of my partners.

## 2020-10-04 NOTE — ED Provider Notes (Signed)
[name]    CSN: [id number] Arrival date & time: 10/04/20  1450      History   Chief Complaint Chief Complaint  Patient presents with   Back Pain    HPI [name] is a 55 y.o. female.   HPI 55 year old female presents with lower back pain for 2 days.  Reports muscle seized up on her briefly after walking up a hill 2 days ago.  Reports that she has had lower back pain since this episode.  PMH significant for left-sided lumbar radiculopathy and left-sided cervical radiculopathy.  LS spine from 07/07/2020 revealed progressive lower thoracic and lumbar multilevel degenerative changes.  Past Medical History:  Diagnosis Date   Hypertension     Patient Active Problem List   Diagnosis Date Noted   Primary osteoarthritis of left hip 07/07/2020   Primary osteoarthritis of left knee 10/05/2015   Left lumbar radiculopathy 10/05/2015   Left cervical radiculopathy 01/22/2015    Past Surgical History:  Procedure Laterality Date   TOE SURGERY     WISDOM TOOTH EXTRACTION      OB History   No obstetric history on file.      Home Medications    Prior to Admission medications   Medication Sig Start Date End Date Taking? Authorizing Provider  baclofen (LIORESAL) 10 MG tablet Take 1 tablet (10 mg total) by mouth 3 (three) times daily. 10/04/20  Yes Trevor Iha, FNP  predniSONE (DELTASONE) 20 MG tablet Take 3 tabs PO daily x 3 days, then 2 tabs PO daily x 3 days, then 1 tab PO daily x 3 days 10/04/20  Yes Trevor Iha, FNP  hydrochlorothiazide (MICROZIDE) 12.5 MG capsule Take 12.5 mg by mouth daily. 06/29/20   [provider]  lisinopril (ZESTRIL) 40 MG tablet Take 40 mg by mouth daily. 06/29/20   [provider]  meloxicam (MOBIC) 15 MG tablet One tab PO qAM with a meal for 2 weeks, then daily prn pain.  Do not take with prednisone 07/07/20   Monica Becton, MD  Probiotic Product (PROBIOTIC DAILY PO) Take by mouth.    [provider]   VITAMIN D, CHOLECALCIFEROL, PO Take by mouth. Reported on 02/19/2015    [provider]    Family History Family History  Problem Relation Age of Onset   Diabetes Mother    Hyperlipidemia Mother    Cancer Father        lung    Social History Social History   Tobacco Use   Smoking status: Former    Types: Cigarettes   Smokeless tobacco: Never  Vaping Use   Vaping Use: Never used  Substance Use Topics   Alcohol use: No   Drug use: No     Allergies   Fish allergy, Penicillins, and Sulfa antibiotics   Review of Systems Review of Systems  Musculoskeletal:  Positive for back pain.  All other systems reviewed and are negative.   Physical Exam Triage Vital Signs ED Triage Vitals  Enc Vitals Group     BP 10/04/20 1505 133/85     Pulse Rate 10/04/20 1505 79     Resp 10/04/20 1505 20     Temp 10/04/20 1505 99 F (37.2 C)     Temp Source 10/04/20 1505 Oral     SpO2 10/04/20 1505 97 %     Weight 10/04/20 1502 290 lb (131.5 kg)     Height 10/04/20 1502 5\' 7"  (1.702 m)     Head  Circumference --      Peak Flow --      Pain Score 10/04/20 1502 8     Pain Loc --      Pain Edu? --      Excl. in GC? --    No data found.  Updated Vital Signs BP 133/85 (BP Location: Right Arm)   Pulse 79   Temp 99 F (37.2 C) (Oral)   Resp 20   Ht 5\' 7"  (1.702 m)   Wt 290 lb (131.5 kg)   LMP 01/23/2013   SpO2 97%   BMI 45.42 kg/m    Physical Exam Vitals and nursing note reviewed.  Constitutional:      General: She is not in acute distress.    Appearance: Normal appearance. She is obese. She is not ill-appearing.  HENT:     Head: Normocephalic and atraumatic.     Mouth/Throat:     Mouth: Mucous membranes are moist.     Pharynx: Oropharynx is clear.  Eyes:     Extraocular Movements: Extraocular movements intact.     Conjunctiva/sclera: Conjunctivae normal.     Pupils: Pupils are equal, round, and reactive to light.  Cardiovascular:     Rate and Rhythm: Normal  rate and regular rhythm.     Pulses: Normal pulses.     Heart sounds: Normal heart sounds. No murmur heard. Pulmonary:     Effort: Pulmonary effort is normal.     Breath sounds: Normal breath sounds.     Comments: No adventitious breath sounds noted Musculoskeletal:        General: No deformity.     Cervical back: Normal range of motion and neck supple. No rigidity.     Right lower leg: No edema.     Left lower leg: No edema.     Comments: LS spine: TTP over spinous processes, left-sided paraspinous muscles, left-sided inferior spinal erectors, with palpable muscle adhesions noted  Skin:    General: Skin is warm and dry.  Neurological:     General: No focal deficit present.     Mental Status: She is alert and oriented to person, place, and time. Mental status is at baseline.  Psychiatric:        Mood and Affect: Mood normal.        Behavior: Behavior normal.        Thought Content: Thought content normal.     UC Treatments / Results  Labs (all labs ordered are listed, but only abnormal results are displayed) Labs Reviewed - No data to display  EKG   Radiology No results found.  Procedures Procedures (including critical care time)  Medications Ordered in UC Medications  methylPREDNISolone sodium succinate (SOLU-MEDROL) 125 mg/2 mL injection 125 mg (125 mg Intramuscular Given 10/04/20 1533)    Initial Impression / Assessment and Plan / UC Course  I have reviewed the triage vital signs and the nursing notes.  Pertinent labs & imaging results that were available during my care of the patient were reviewed by me and considered in my medical decision making (see chart for details).     MDM: 1.  Strain of lumbar region, initial encounter-IM Solu-Medrol 125 given once in clinic prior to discharge today; 2.  Muscle spasms of back-Rx'd Baclofen; 3.  Lumbar radiculopathy-Rx'd Prednisone taper. Advised/instructed patient to start oral prednisone taper tomorrow morning, Tuesday,  10/05/2020.  Advised/instructed patient may start baclofen now.  Encouraged patient increase daily water intake while taking these medications.  Advised/instructed  patient to avoid moderate to strenuous activities; including repetitive motion activities involving affected area of lower back for the next 7 to 10 days.  Patient discharged home, hemodynamically stable.  Final Clinical Impressions(s) / UC Diagnoses   Final diagnoses:  Strain of lumbar region, initial encounter  Muscle spasm of back  Lumbar radiculopathy     Discharge Instructions      Advised/instructed patient to start oral prednisone taper tomorrow morning, Tuesday, 10/05/2020.  Advised/instructed patient may start baclofen now.  Encouraged patient increase daily water intake while taking these medications.  Advised/instructed patient to avoid moderate to strenuous activities; including repetitive motion activities involving affected area of lower back for the next 7 to 10 days.     ED Prescriptions     Medication Sig Dispense Auth. Provider   predniSONE (DELTASONE) 20 MG tablet Take 3 tabs PO daily x 3 days, then 2 tabs PO daily x 3 days, then 1 tab PO daily x 3 days 18 tablet Trevor Iha, FNP   baclofen (LIORESAL) 10 MG tablet Take 1 tablet (10 mg total) by mouth 3 (three) times daily. 30 each Trevor Iha, FNP      PDMP not reviewed this encounter.   Trevor Iha, FNP 10/04/20 1538

## 2020-10-04 NOTE — Discharge Instructions (Addendum)
Advised/instructed patient to start oral prednisone taper tomorrow morning, Tuesday, 10/05/2020.  Advised/instructed patient may start baclofen now.  Encouraged patient increase daily water intake while taking these medications.  Advised/instructed patient to avoid moderate to strenuous activities; including repetitive motion activities involving affected area of lower back for the next 7 to 10 days.

## 2021-04-05 ENCOUNTER — Other Ambulatory Visit: Payer: Self-pay

## 2021-04-05 ENCOUNTER — Emergency Department (INDEPENDENT_AMBULATORY_CARE_PROVIDER_SITE_OTHER)
Admission: RE | Admit: 2021-04-05 | Discharge: 2021-04-05 | Disposition: A | Discharge status: Home / Self Care | Payer: BC Managed Care – PPO | Source: Ambulatory Visit

## 2021-04-05 VITALS — BP 139/76 | HR 82 | Temp 97.8°F | Resp 17

## 2021-04-05 DIAGNOSIS — M5431 Sciatica, right side: Secondary | ICD-10-CM | POA: Diagnosis not present

## 2021-04-05 MED ORDER — BACLOFEN 10 MG PO TABS: 10.0000 mg | ORAL_TABLET | Freq: Three times a day (TID) | ORAL | 0 refills | Status: DC

## 2021-04-05 MED ORDER — METHYLPREDNISOLONE SODIUM SUCC 125 MG IJ SOLR
125.0000 mg | Freq: Once | INTRAMUSCULAR | Status: AC
  Administered 2021-04-05: 125 mg via INTRAMUSCULAR

## 2021-04-05 MED ORDER — PREDNISONE 20 MG PO TABS: ORAL_TABLET | ORAL | 0 refills | Status: DC

## 2021-04-05 NOTE — ED Triage Notes (Signed)
Pt c/o back pain with no injury since yesterday. Hx of sciatica. Last visit for that was 9/22. Pain 7/10 Baclofen prn. ?

## 2021-04-05 NOTE — ED Provider Notes (Signed)
?[facility] ? ? ? ?CSN: [id number] ?Arrival date & time: 04/05/21  1529 ? ? ?  ? ?History   ?Chief Complaint ?Chief Complaint  ?Patient presents with  ? Back Pain  ?  Appt 330PM  ? ? ?HPI ?[facility]acy [name] is a 56 y.o. female.  ? ?HPI 56 year old female presents with right-sided lower back pain since yesterday.  Patient denies injury or insult to lower back.  PMH significant for left-sided lumbar radiculopathy.  Patient currently reports pain is 7 of 10 and has been using baclofen as needed.  Patient reports last visit for similar symptoms was 10/04/20 patient was evaluated by me at that office visit.  PMH significant for morbid obesity, left-sided lumbar radiculopathy and left-sided cervical radiculopathy. ? ?Past Medical History:  ?Diagnosis Date  ? Hypertension   ? ? ?Patient Active Problem List  ? Diagnosis Date Noted  ? Primary osteoarthritis of left hip 07/07/2020  ? Primary osteoarthritis of left knee 10/05/2015  ? Left lumbar radiculopathy 10/05/2015  ? Left cervical radiculopathy 01/22/2015  ? ? ?Past Surgical History:  ?Procedure Laterality Date  ? TOE SURGERY    ? WISDOM TOOTH EXTRACTION    ? ? ?OB History   ?No obstetric history on file. ?  ? ? ? ?Home Medications   ? ?Prior to Admission medications   ?Medication Sig Start Date End Date Taking? Authorizing Provider  ?baclofen (LIORESAL) 10 MG tablet Take 1 tablet (10 mg total) by mouth 3 (three) times daily. 04/05/21  Yes Trevor Iha, FNP  ?predniSONE (DELTASONE) 20 MG tablet Take 3 tabs PO daily x 3 days, then 2 tabs PO daily x 3 days, then 1 tab PO daily x 3 days 04/05/21  Yes Trevor Iha, FNP  ?hydrochlorothiazide (MICROZIDE) 12.5 MG capsule Take 12.5 mg by mouth daily. 06/29/20   [provider]  ?lisinopril (ZESTRIL) 40 MG tablet Take 40 mg by mouth daily. 06/29/20   [provider]  ?meloxicam (MOBIC) 15 MG tablet One tab PO qAM with a meal for 2 weeks, then daily prn pain.  Do not take with prednisone 07/07/20    Monica Becton, MD  ?Probiotic Product (PROBIOTIC DAILY PO) Take by mouth.    [provider]  ?VITAMIN D, CHOLECALCIFEROL, PO Take by mouth. Reported on 02/19/2015    [provider]  ? ? ?Family History ?Family History  ?Problem Relation Age of Onset  ? Diabetes Mother   ? Hyperlipidemia Mother   ? Cancer Father   ?     lung  ? ? ?Social History ?Social History  ? ?Tobacco Use  ? Smoking status: Former  ?  Types: Cigarettes  ? Smokeless tobacco: Never  ?Vaping Use  ? Vaping Use: Never used  ?Substance Use Topics  ? Alcohol use: No  ? Drug use: No  ? ? ? ?Allergies   ?Fish allergy, Penicillins, and Sulfa antibiotics ? ? ?Review of Systems ?Review of Systems  ?Musculoskeletal:  Positive for back pain.  ? ? ?Physical Exam ?Triage Vital Signs ?ED Triage Vitals  ?Enc Vitals Group  ?   BP 04/05/21 1540 139/76  ?   Pulse Rate 04/05/21 1540 82  ?   Resp 04/05/21 1540 17  ?   Temp 04/05/21 1540 97.8 ?F (36.6 ?C)  ?   Temp Source 04/05/21 1540 Oral  ?   SpO2 04/05/21 1540 98 %  ?   Weight --   ?   Height --   ?  Head Circumference --   ?   Peak Flow --   ?   Pain Score 04/05/21 1539 7  ?   Pain Loc --   ?   Pain Edu? --   ?   Excl. in GC? --   ? ?No data found. ? ?Updated Vital Signs ?BP 139/76 (BP Location: Right Arm)   Pulse 82   Temp 97.8 ?F (36.6 ?C) (Oral)   Resp 17   LMP 01/23/2013   SpO2 98%  ? ?  ? ?Physical Exam ?Vitals and nursing note reviewed.  ?Constitutional:   ?   General: She is not in acute distress. ?   Appearance: Normal appearance. She is obese. She is not ill-appearing.  ?HENT:  ?   Head: Normocephalic and atraumatic.  ?   Mouth/Throat:  ?   Mouth: Mucous membranes are moist.  ?   Pharynx: Oropharynx is clear.  ?Eyes:  ?   Extraocular Movements: Extraocular movements intact.  ?   Conjunctiva/sclera: Conjunctivae normal.  ?   Pupils: Pupils are equal, round, and reactive to light.  ?Cardiovascular:  ?   Rate and Rhythm: Normal rate and regular rhythm.  ?   Pulses: Normal  pulses.  ?   Heart sounds: Normal heart sounds.  ?Pulmonary:  ?   Effort: Pulmonary effort is normal.  ?   Breath sounds: Normal breath sounds. No wheezing, rhonchi or rales.  ?Musculoskeletal:  ?   Cervical back: Normal range of motion and neck supple.  ?   Comments: Lower back/lumbar sacral spine (right-sided): TTP over right sided paraspinous muscles and right sided inferior spinal erectors with patient reporting radicular type pain from right hip, right buttocks to right upper leg for the past 2 days.  ?Skin: ?   General: Skin is warm and dry.  ?Neurological:  ?   General: No focal deficit present.  ?   Mental Status: She is alert and oriented to person, place, and time.  ? ? ? ?UC Treatments / Results  ?Labs ?(all labs ordered are listed, but only abnormal results are displayed) ?Labs Reviewed - No data to display ? ?EKG ? ? ?Radiology ?No results found. ? ?Procedures ?Procedures (including critical care time) ? ?Medications Ordered in UC ?Medications  ?methylPREDNISolone sodium succinate (SOLU-MEDROL) 125 mg/2 mL injection 125 mg (125 mg Intramuscular Given 04/05/21 1650)  ? ? ?Initial Impression / Assessment and Plan / UC Course  ?I have reviewed the triage vital signs and the nursing notes. ? ?Pertinent labs & imaging results that were available during my care of the patient were reviewed by me and considered in my medical decision making (see chart for details). ? ?  ? ?MDM: 1.  Sciatica of right side-IM Solu-Medrol 125 mg given once in clinic. Advised patient to take medication as directed with food to completion.  Instructed patient to start oral Prednisone taper tomorrow morning, Wednesday, 04/06/2021.  Patient may take Baclofen daily or as needed for accompanying muscle spasms of back.  Encouraged patient to increase daily water intake while taking these medications.  Advised/encouraged patient to avoid repetitive motion activities, lifting, pushing, pulling involving the affected area of right lower back  for the next 10 days.  Advised patient if symptoms worsen and/or unresolved please follow-up with PCP or here for further evaluation.  Patient discharged home, hemodynamically stable. ?Final Clinical Impressions(s) / UC Diagnoses  ? ?Final diagnoses:  ?Sciatica of right side  ? ? ? ?Discharge Instructions   ? ?  ?  Advised patient to take medication as directed with food to completion.  Instructed patient to start oral Prednisone taper tomorrow morning, Wednesday, 04/06/2021.  Patient may take Baclofen daily or as needed for accompanying muscle spasms of back.  Encouraged patient to increase daily water intake while taking these medications.  Advised/encouraged patient to avoid repetitive motion activities, lifting, pushing, pulling involving the affected area of right lower back for the next 10 days.  Advised patient if symptoms worsen and/or unresolved please follow-up with PCP or here for further evaluation. ? ? ? ? ?ED Prescriptions   ? ? Medication Sig Dispense Auth. Provider  ? predniSONE (DELTASONE) 20 MG tablet Take 3 tabs PO daily x 3 days, then 2 tabs PO daily x 3 days, then 1 tab PO daily x 3 days 18 tablet Trevor Ihaagan, Brylyn Novakovich, FNP  ? baclofen (LIORESAL) 10 MG tablet Take 1 tablet (10 mg total) by mouth 3 (three) times daily. 30 each Trevor Ihaagan, Storm Sovine, FNP  ? ?  ? ?PDMP not reviewed this encounter. ?  ?Trevor IhaRagan, Garnett Nunziata, FNP ?04/05/21 1652 ? ?

## 2021-04-05 NOTE — Discharge Instructions (Addendum)
Advised patient to take medication as directed with food to completion.  Instructed patient to start oral Prednisone taper tomorrow morning, Wednesday, 04/06/2021.  Patient may take Baclofen daily or as needed for accompanying muscle spasms of back.  Encouraged patient to increase daily water intake while taking these medications.  Advised/encouraged patient to avoid repetitive motion activities, lifting, pushing, pulling involving the affected area of right lower back for the next 10 days.  Advised patient if symptoms worsen and/or unresolved please follow-up with PCP or here for further evaluation. ?

## 2021-04-18 ENCOUNTER — Emergency Department (INDEPENDENT_AMBULATORY_CARE_PROVIDER_SITE_OTHER): Payer: BC Managed Care – PPO

## 2021-04-18 ENCOUNTER — Emergency Department (INDEPENDENT_AMBULATORY_CARE_PROVIDER_SITE_OTHER)
Admission: RE | Admit: 2021-04-18 | Discharge: 2021-04-18 | Disposition: A | Discharge status: Home / Self Care | Payer: BC Managed Care – PPO | Source: Ambulatory Visit

## 2021-04-18 VITALS — BP 105/65 | HR 70 | Temp 99.1°F | Resp 18 | Ht 67.0 in | Wt 296.0 lb

## 2021-04-18 DIAGNOSIS — M5440 Lumbago with sciatica, unspecified side: Secondary | ICD-10-CM

## 2021-04-18 DIAGNOSIS — M47816 Spondylosis without myelopathy or radiculopathy, lumbar region: Secondary | ICD-10-CM

## 2021-04-18 MED ORDER — DICLOFENAC SODIUM 75 MG PO TBEC: 75.0000 mg | DELAYED_RELEASE_TABLET | Freq: Two times a day (BID) | ORAL | 0 refills | Status: AC

## 2021-04-18 NOTE — ED Provider Notes (Signed)
?[facility] ? ? ? ?CSN: [id number] ?Arrival date & time: 04/18/21  1642 ? ? ?  ? ?History   ?Chief Complaint ?Chief Complaint  ?Patient presents with  ? Back Pain  ?  I have not gotten relief of my symptoms since seeing M Ragen two weeks ago.  I have an appt scheduled with Dr. Karie Schwalbe but it isn't until 05/02/21.  My pain is substantial and I am hopeful you can order x-rays and/or provide additional treatment. - Entered by patient  ? ? ?HPI ?Payal Stanforth is a 56 y.o. female.  ? ?HPI 56 year old female presents with low back pain states that she does not have an appointment with her PCP until 05/02/2021.  PMH significant for morbid obesity and lumbar radiculopathy.  Patient was evaluated and treated by me for sciatica of right side on 04/05/2021.  Patient received IM Solu-Medrol 125 in clinic, prednisone taper for 9 days starting the next day, and Baclofen for accompanying lower back muscle spasms. ? ?Past Medical History:  ?Diagnosis Date  ? Hypertension   ? ? ?Patient Active Problem List  ? Diagnosis Date Noted  ? Primary osteoarthritis of left hip 07/07/2020  ? Primary osteoarthritis of left knee 10/05/2015  ? Left lumbar radiculopathy 10/05/2015  ? Left cervical radiculopathy 01/22/2015  ? ? ?Past Surgical History:  ?Procedure Laterality Date  ? TOE SURGERY    ? WISDOM TOOTH EXTRACTION    ? ? ?OB History   ?No obstetric history on file. ?  ? ? ? ?Home Medications   ? ?Prior to Admission medications   ?Medication Sig Start Date End Date Taking? Authorizing Provider  ?diclofenac (VOLTAREN) 75 MG EC tablet Take 1 tablet (75 mg total) by mouth 2 (two) times daily for 14 days. 04/18/21 05/02/21 Yes Trevor Iha, FNP  ?hydrochlorothiazide (MICROZIDE) 12.5 MG capsule Take 12.5 mg by mouth daily. 06/29/20  Yes [provider]  ?lisinopril (ZESTRIL) 40 MG tablet Take 40 mg by mouth daily. 06/29/20  Yes [provider]  ?Probiotic Product (PROBIOTIC DAILY PO) Take by mouth.   Yes [provider]   ?VITAMIN D, CHOLECALCIFEROL, PO Take by mouth. Reported on 02/19/2015   Yes [provider]  ? ? ?Family History ?Family History  ?Problem Relation Age of Onset  ? Diabetes Mother   ? Hyperlipidemia Mother   ? Cancer Father   ?     lung  ? ? ?Social History ?Social History  ? ?Tobacco Use  ? Smoking status: Former  ?  Types: Cigarettes  ? Smokeless tobacco: Never  ?Vaping Use  ? Vaping Use: Never used  ?Substance Use Topics  ? Alcohol use: No  ? Drug use: No  ? ? ? ?Allergies   ?Fish allergy, Penicillins, and Sulfa antibiotics ? ? ?Review of Systems ?Review of Systems  ?All other systems reviewed and are negative. ? ? ?Physical Exam ?Triage Vital Signs ?ED Triage Vitals  ?Enc Vitals Group  ?   BP 04/18/21 1714 105/65  ?   Pulse Rate 04/18/21 1714 70  ?   Resp 04/18/21 1714 18  ?   Temp 04/18/21 1714 99.1 ?F (37.3 ?C)  ?   Temp Source 04/18/21 1714 Oral  ?   SpO2 04/18/21 1714 97 %  ?   Weight 04/18/21 1713 296 lb (134.3 kg)  ?   Height 04/18/21 1713 5\' 7"  (1.702 m)  ?   Head Circumference --   ?   Peak Flow --   ?  Pain Score 04/18/21 1713 8  ?   Pain Loc --   ?   Pain Edu? --   ?   Excl. in GC? --   ? ?No data found. ? ?Updated Vital Signs ?BP 105/65 (BP Location: Right Arm)   Pulse 70   Temp 99.1 ?F (37.3 ?C) (Oral)   Resp 18   Ht 5\' 7"  (1.702 m)   Wt 296 lb (134.3 kg)   LMP 01/23/2013   SpO2 97%   BMI 46.36 kg/m?  ? ? ?Physical Exam ?Vitals and nursing note reviewed.  ?Constitutional:   ?   Appearance: Normal appearance. She is obese.  ?HENT:  ?   Head: Normocephalic and atraumatic.  ?   Mouth/Throat:  ?   Mouth: Mucous membranes are moist.  ?   Pharynx: Oropharynx is clear.  ?Eyes:  ?   Extraocular Movements: Extraocular movements intact.  ?   Conjunctiva/sclera: Conjunctivae normal.  ?   Pupils: Pupils are equal, round, and reactive to light.  ?Cardiovascular:  ?   Rate and Rhythm: Normal rate and regular rhythm.  ?   Pulses: Normal pulses.  ?   Heart sounds: Normal heart sounds.  ?Pulmonary:   ?   Effort: Pulmonary effort is normal.  ?   Breath sounds: Normal breath sounds. No wheezing, rhonchi or rales.  ?Musculoskeletal:  ?   Cervical back: Normal range of motion and neck supple.  ?   Comments: Low back/lumbar sacral spine: TTP over spinous processes and bilateral paraspinous muscles  ?Skin: ?   General: Skin is warm and dry.  ?Neurological:  ?   General: No focal deficit present.  ?   Mental Status: She is alert and oriented to person, place, and time.  ? ? ? ?UC Treatments / Results  ?Labs ?(all labs ordered are listed, but only abnormal results are displayed) ?Labs Reviewed - No data to display ? ?EKG ? ? ?Radiology ?DG Lumbar Spine Complete ? ?Result Date: 04/18/2021 ?CLINICAL DATA:  Sciatica, lower back pain for few weeks. EXAM: LUMBAR SPINE - COMPLETE 4+ VIEW COMPARISON:  None. FINDINGS: There is no evidence of lumbar spine fracture. Straightening of the lumbar spine. Multilevel degenerative disc disease with disc space loss and marginal osteophytes. Multilevel facet joint arthropathy. IMPRESSION: 1. No evidence of acute fracture or subluxation. 2. Moderate multilevel degenerate disc disease with disc space narrowing and prominent osteophytes with associated multilevel facet joint arthropathy. Electronically Signed   By: 06/18/2021 D.O.   On: 04/18/2021 17:43   ? ?Procedures ?Procedures (including critical care time) ? ?Medications Ordered in UC ?Medications - No data to display ? ?Initial Impression / Assessment and Plan / UC Course  ?I have reviewed the triage vital signs and the nursing notes. ? ?Pertinent labs & imaging results that were available during my care of the patient were reviewed by me and considered in my medical decision making (see chart for details). ? ?  ? ?MDM: 1.  Spondylosis of lumbar spine-Rx'd Diclofenac. Advised/informed patient of lumbar spine x-ray results today with hard copy provided to patient.  Advised patient to discontinue Mobic, we will treat lower back pain  with Dicofenac.  Advised patient if symptoms worsen and/or unresolved please follow-up with PCP or orthopedics for further evaluation.  Discharged home, hemodynamically stable. ?Final Clinical Impressions(s) / UC Diagnoses  ? ?Final diagnoses:  ?Spondylosis of lumbar spine  ? ? ? ?Discharge Instructions   ? ?  ?Advised/informed patient of lumbar spine x-ray results  today with hard copy provided to patient.  Advised patient to discontinue Mobic, we will treat lower back pain with Dicofenac.  Advised patient if symptoms worsen and/or unresolved please follow-up with PCP or orthopedics for further evaluation. ? ? ? ? ?ED Prescriptions   ? ? Medication Sig Dispense Auth. Provider  ? diclofenac (VOLTAREN) 75 MG EC tablet Take 1 tablet (75 mg total) by mouth 2 (two) times daily for 14 days. 28 tablet Trevor Ihaagan, Kleo Dungee, FNP  ? ?  ? ?PDMP not reviewed this encounter. ?  ?Trevor IhaRagan, Chizara Mena, FNP ?04/18/21 1820 ? ?

## 2021-04-18 NOTE — ED Triage Notes (Signed)
Pt states that she still has some back pain. Pt states that she does have an appt with otho but not until 4/17. ?

## 2021-04-18 NOTE — Discharge Instructions (Addendum)
Advised/informed patient of lumbar spine x-ray results today with hard copy provided to patient.  Advised patient to discontinue Mobic, we will treat lower back pain with Dicofenac.  Advised patient if symptoms worsen and/or unresolved please follow-up with PCP or orthopedics for further evaluation. ?

## 2021-05-02 ENCOUNTER — Encounter: Payer: Self-pay | Admitting: Sports Medicine

## 2021-05-02 ENCOUNTER — Ambulatory Visit: Payer: BC Managed Care – PPO | Admitting: Sports Medicine

## 2021-05-02 DIAGNOSIS — M51369 Other intervertebral disc degeneration, lumbar region without mention of lumbar back pain or lower extremity pain: Secondary | ICD-10-CM

## 2021-05-02 DIAGNOSIS — M5136 Other intervertebral disc degeneration, lumbar region: Secondary | ICD-10-CM | POA: Diagnosis not present

## 2021-05-02 DIAGNOSIS — J302 Other seasonal allergic rhinitis: Secondary | ICD-10-CM | POA: Diagnosis not present

## 2021-05-02 MED ORDER — BENZONATATE 200 MG PO CAPS: 200.0000 mg | ORAL_CAPSULE | Freq: Three times a day (TID) | ORAL | 0 refills | Status: DC | PRN

## 2021-05-02 MED ORDER — FEXOFENADINE HCL 180 MG PO TABS: 180.0000 mg | ORAL_TABLET | Freq: Every day | ORAL | 3 refills | Status: AC

## 2021-05-02 MED ORDER — FEXOFENADINE HCL 180 MG PO TABS: 180.0000 mg | ORAL_TABLET | Freq: Every day | ORAL | 3 refills | Status: DC

## 2021-05-02 NOTE — Assessment & Plan Note (Signed)
Cough, typically seasonal, no constitutional symptoms. ?She can discuss this further with her PCP but I am happy to give her some Tessalon Perles and fexofenadine in the meantime as a persistent cough will only heart her back more. ?

## 2021-05-02 NOTE — Addendum Note (Signed)
Addended by: Carolin Coy on: 05/02/2021 01:43 PM ? ? Modules accepted: Orders ? ?

## 2021-05-02 NOTE — Progress Notes (Signed)
? ? ?  Procedures performed today:   ? ?None. ? ?Independent interpretation of notes and tests performed by another provider:  ? ?None. ? ?Brief History, Exam, Impression, and Recommendations:   ? ?Lumbar degenerative disc disease ?Tracy Tracy [name] returns, she is a pleasant 56 year old female, longstanding low back pain, axial and discogenic. ?Historically radicular down the left leg, more recently axial in the right. ?She had multiple visits with me, urgent care she has had x-rays. ?Multiple doses of steroid, physician directed conservative treatment as well as formal physical therapy. ?Persistent discomfort, proceeding with MRI for epidural planning. ?She also needs to lose significant amount of weight.   ?She can discuss that with Tracy [name] PCP. ? ?Seasonal allergies ?Cough, typically seasonal, no constitutional symptoms. ?She can discuss this further with Tracy [name] PCP but I am happy to give Tracy [name] some Tessalon Perles and fexofenadine in the meantime as a persistent cough will only heart Tracy [name] back more. ? ? ? ?___________________________________________ ?Tracy Tracy [name]. Tracy Tracy [name], M.D., ABFM., CAQSM. ?Primary Care and Sports Medicine ?[place] ? ?Adjunct Instructor of Family Medicine  ?[employer] ?

## 2021-05-02 NOTE — Assessment & Plan Note (Signed)
Shifra returns, she is a pleasant 56 year old female, longstanding low back pain, axial and discogenic. ?Historically radicular down the left leg, more recently axial in the right. ?She had multiple visits with me, urgent care she has had x-rays. ?Multiple doses of steroid, physician directed conservative treatment as well as formal physical therapy. ?Persistent discomfort, proceeding with MRI for epidural planning. ?She also needs to lose significant amount of weight.   ?She can discuss that with her PCP. ?

## 2021-05-24 ENCOUNTER — Ambulatory Visit (INDEPENDENT_AMBULATORY_CARE_PROVIDER_SITE_OTHER): Payer: BC Managed Care – PPO

## 2021-05-24 DIAGNOSIS — M5136 Other intervertebral disc degeneration, lumbar region: Secondary | ICD-10-CM | POA: Diagnosis not present

## 2021-05-27 ENCOUNTER — Encounter (INDEPENDENT_AMBULATORY_CARE_PROVIDER_SITE_OTHER): Payer: BC Managed Care – PPO | Admitting: Sports Medicine

## 2021-05-27 DIAGNOSIS — M51369 Other intervertebral disc degeneration, lumbar region without mention of lumbar back pain or lower extremity pain: Secondary | ICD-10-CM

## 2021-05-27 DIAGNOSIS — M5136 Other intervertebral disc degeneration, lumbar region: Secondary | ICD-10-CM

## 2021-05-27 NOTE — Telephone Encounter (Signed)
I spent 5 total minutes of online digital evaluation and management services in this patient-initiated request for online care. 

## 2022-01-11 ENCOUNTER — Encounter (INDEPENDENT_AMBULATORY_CARE_PROVIDER_SITE_OTHER): Payer: BC Managed Care – PPO | Admitting: Sports Medicine

## 2022-01-11 DIAGNOSIS — M5136 Other intervertebral disc degeneration, lumbar region: Secondary | ICD-10-CM

## 2022-01-11 DIAGNOSIS — M51369 Other intervertebral disc degeneration, lumbar region without mention of lumbar back pain or lower extremity pain: Secondary | ICD-10-CM

## 2022-01-12 MED ORDER — DICLOFENAC SODIUM 75 MG PO TBEC: 75.0000 mg | DELAYED_RELEASE_TABLET | Freq: Two times a day (BID) | ORAL | 3 refills | Status: DC

## 2022-01-12 NOTE — Telephone Encounter (Signed)
I spent 5 total minutes of online digital evaluation and management services in this patient-initiated request for online care. 

## 2022-10-26 ENCOUNTER — Telehealth: Payer: Self-pay

## 2022-10-26 ENCOUNTER — Ambulatory Visit
Admission: RE | Admit: 2022-10-26 | Discharge: 2022-10-26 | Disposition: A | Discharge status: Home / Self Care | Payer: BC Managed Care – PPO | Source: Ambulatory Visit | Attending: Family Medicine | Admitting: Family Medicine

## 2022-10-26 ENCOUNTER — Ambulatory Visit: Payer: BC Managed Care – PPO

## 2022-10-26 VITALS — BP 160/83 | HR 82 | Temp 98.9°F | Resp 17

## 2022-10-26 DIAGNOSIS — M25561 Pain in right knee: Secondary | ICD-10-CM | POA: Diagnosis not present

## 2022-10-26 DIAGNOSIS — M1711 Unilateral primary osteoarthritis, right knee: Secondary | ICD-10-CM | POA: Diagnosis not present

## 2022-10-26 MED ORDER — HYDROCODONE-ACETAMINOPHEN 5-325 MG PO TABS: 2.0000 | ORAL_TABLET | Freq: Three times a day (TID) | ORAL | 0 refills | Status: AC | PRN

## 2022-10-26 NOTE — ED Triage Notes (Addendum)
Pt c/o RT knee pain x 10 days. Has had a couple episodes where her "knee just gave out". Worsening over last couple days affecting her gait. Denies previous knee injuries. Diclofenac prn.

## 2022-10-26 NOTE — ED Provider Notes (Signed)
[facility]    CSN: [id number] Arrival date & time: 10/26/22  1027      History   Chief Complaint Chief Complaint  Patient presents with   Knee Pain    RT    HPI Tracy [name] is a 57 y.o. female.   HPI pleasant 57 year old female presents with left knee pain x 10 days.  Reports several episodes where her knee just gave out.  Patient denies any recent knee injuries.  Reports taking diclofenac as needed.  PMH significant for morbid obesity, primary osteoarthritis of left knee, and HTN.  Past Medical History:  Diagnosis Date   Hypertension     Patient Active Problem List   Diagnosis Date Noted   Seasonal allergies 05/02/2021   Primary osteoarthritis of left hip 07/07/2020   Primary osteoarthritis of left knee 10/05/2015   Lumbar degenerative disc disease 10/05/2015   Left cervical radiculopathy 01/22/2015    Past Surgical History:  Procedure Laterality Date   TOE SURGERY     WISDOM TOOTH EXTRACTION      OB History   No obstetric history on file.      Home Medications    Prior to Admission medications   Medication Sig Start Date End Date Taking? Authorizing Provider  HYDROcodone-acetaminophen (NORCO/VICODIN) 5-325 MG tablet Take 2 tablets by mouth every 8 (eight) hours as needed. 10/26/22  Yes Trevor Iha, FNP  diclofenac (VOLTAREN) 75 MG EC tablet Take 1 tablet (75 mg total) by mouth 2 (two) times daily. 01/12/22 01/12/23  Monica Becton, MD  fexofenadine (ALLEGRA) 180 MG tablet Take 1 tablet (180 mg total) by mouth daily. 05/02/21   Monica Becton, MD  hydrochlorothiazide (MICROZIDE) 12.5 MG capsule Take 12.5 mg by mouth daily. 06/29/20   [provider]  lisinopril (ZESTRIL) 40 MG tablet Take 40 mg by mouth daily. 06/29/20   [provider]  Probiotic Product (PROBIOTIC DAILY PO) Take by mouth.    [provider]  VITAMIN D, CHOLECALCIFEROL, PO Take by mouth. Reported on 02/19/2015    [provider]    Family History Family History  Problem Relation Age of Onset   Diabetes Mother    Hyperlipidemia Mother    Cancer Father        lung    Social History Social History   Tobacco Use   Smoking status: Former    Types: Cigarettes   Smokeless tobacco: Never  Vaping Use   Vaping status: Never Used  Substance Use Topics   Alcohol use: No   Drug use: No     Allergies   Fish allergy, Penicillins, and Sulfa antibiotics   Review of Systems Review of Systems   Physical Exam Triage Vital Signs ED Triage Vitals  Encounter Vitals Group     BP 10/26/22 1047 (!) 160/83     Systolic BP Percentile --      Diastolic BP Percentile --      Pulse Rate 10/26/22 1047 82     Resp 10/26/22 1047 17     Temp 10/26/22 1047 98.9 F (37.2 C)     Temp Source 10/26/22 1047 Oral     SpO2 10/26/22 1047 98 %     Weight --      Height --      Head Circumference --      Peak Flow --      Pain Score 10/26/22 1049 1     Pain Loc --  Pain Education --      Exclude from Growth Chart --    No data found.  Updated Vital Signs BP (!) 160/83 (BP Location: Right Arm)   Pulse 82   Temp 98.9 F (37.2 C) (Oral)   Resp 17   LMP 01/23/2013   SpO2 98%    Physical Exam Vitals and nursing note reviewed.  Constitutional:      General: She is not in acute distress.    Appearance: Normal appearance. She is obese. She is not ill-appearing.  HENT:     Head: Normocephalic and atraumatic.     Mouth/Throat:     Mouth: Mucous membranes are moist.     Pharynx: Oropharynx is clear.  Eyes:     Extraocular Movements: Extraocular movements intact.     Conjunctiva/sclera: Conjunctivae normal.     Pupils: Pupils are equal, round, and reactive to light.  Cardiovascular:     Rate and Rhythm: Normal rate and regular rhythm.     Pulses: Normal pulses.     Heart sounds: Normal heart sounds.  Pulmonary:     Effort: Pulmonary effort is normal.     Breath sounds: Normal breath sounds. No wheezing  or rales.  Musculoskeletal:        General: Normal range of motion.     Cervical back: Normal range of motion and neck supple.     Comments: Right knee (anterior aspect over patella): TTP with moderate soft tissue swelling, exam limited due to pain today  Skin:    General: Skin is warm and dry.  Neurological:     General: No focal deficit present.     Mental Status: She is alert and oriented to person, place, and time. Mental status is at baseline.      UC Treatments / Results  Labs (all labs ordered are listed, but only abnormal results are displayed) Labs Reviewed - No data to display  EKG   Radiology DG Knee Complete 4 Views Right  Result Date: 10/26/2022 CLINICAL DATA:  Right knee pain for the past 2 days. EXAM: RIGHT KNEE - COMPLETE 4+ VIEW COMPARISON:  None Available. FINDINGS: Suspected trace knee joint effusion. No associated fracture or dislocation. Moderate tricompartmental degenerative change of the knee, worse within the patellofemoral joint with joint space loss, subchondral sclerosis and osteophytosis. No evidence of chondrocalcinosis. Suspected loose body about the posterior aspect of the knee joint space though a discrete donor site is not identified. Regional soft tissues appear normal. IMPRESSION: 1. Suspected trace knee joint effusion without associated fracture or dislocation. 2. Moderate tricompartmental degenerative change of the knee, worse within the patellofemoral joint. Electronically Signed   By: Simonne Come M.D.   On: 10/26/2022 13:00    Procedures Procedures (including critical care time)  Medications Ordered in UC Medications - No data to display  Initial Impression / Assessment and Plan / UC Course  I have reviewed the triage vital signs and the nursing notes.  Pertinent labs & imaging results that were available during my care of the patient were reviewed by me and considered in my medical decision making (see chart for details).     MDM: 1.   Acute pain of right knee-right knee x-ray results revealed above (patient notified of these results today 10/26/2022 at 1:30 PM, Rx'd Norco 5/325 mg tablet take 1 tablet every 8 hours for acute right knee pain; 2.  Primary osteoarthritis of right knee-right knee x-ray results revealed above, advised patient may continue previously  prescribed diclofenac 75 mg tablet twice daily. Advised patient may take previously prescribed diclofenac for right knee pain.  Advised may use Norco for acute right knee pain.  Advised patient of sedative effects of this medication.  Encouraged to increase daily water intake to 64 ounces per day while taking this medication.  Advised we will follow-up with right knee x-ray results by the end of the day.  Advised if symptoms worsen and/or unresolved please follow-up with [facility] for further evaluation.  Final Clinical Impressions(s) / UC Diagnoses   Final diagnoses:  Acute pain of right knee  Primary osteoarthritis of right knee     Discharge Instructions      Advised patient may take previously prescribed diclofenac for right knee pain.  Advised may use Norco for acute right knee pain.  Advised patient of sedative effects of this medication.  Encouraged to increase daily water intake to 64 ounces per day while taking this medication.  Advised we will follow-up with right knee x-ray results by the end of the day.  Advised if symptoms worsen and/or unresolved please follow-up with [facility] orth[facility] for further evaluation.     ED Prescriptions     Medication Sig Dispense Auth. Provider   HYDROcodone-acetaminophen (NORCO/VICODIN) 5-325 MG tablet Take 2 tablets by mouth every 8 (eight) hours as needed. 24 tablet Trevor Iha, FNP      I have reviewed the PDMP during this encounter.   Trevor Iha, FNP 10/26/22 1319

## 2022-10-26 NOTE — Discharge Instructions (Addendum)
Advised patient may take previously prescribed diclofenac for right knee pain.  Advised may use Norco for acute right knee pain.  Advised patient of sedative effects of this medication.  Encouraged to increase daily water intake to 64 ounces per day while taking this medication.  Advised we will follow-up with right knee x-ray results by the end of the day.  Advised if symptoms worsen and/or unresolved please follow-up with [facility] orth[facility] for further evaluation.

## 2022-10-26 NOTE — Telephone Encounter (Signed)
Called pt to inform of xray results from earlier visit. Advised to f/u with sports med/orthopedic doctor for further eval of knee pain.

## 2023-01-05 ENCOUNTER — Ambulatory Visit: Payer: BC Managed Care – PPO

## 2023-01-05 ENCOUNTER — Ambulatory Visit: Payer: BC Managed Care – PPO | Admitting: Sports Medicine

## 2023-01-05 DIAGNOSIS — M170 Bilateral primary osteoarthritis of knee: Secondary | ICD-10-CM | POA: Diagnosis not present

## 2023-01-05 DIAGNOSIS — M7661 Achilles tendinitis, right leg: Secondary | ICD-10-CM

## 2023-01-05 MED ORDER — NITROGLYCERIN 0.2 MG/HR TD PT24: MEDICATED_PATCH | TRANSDERMAL | 11 refills | Status: DC

## 2023-01-05 MED ORDER — ACETAMINOPHEN ER 650 MG PO TBCR: 650.0000 mg | EXTENDED_RELEASE_TABLET | Freq: Three times a day (TID) | ORAL | Status: AC | PRN

## 2023-01-05 NOTE — Assessment & Plan Note (Signed)
Known bilateral knee osteoarthritis, moderate pain. Diclofenac is giving her some constipation, she will switch to arthritis strength Tylenol, I would like PT to work on her knee, if not better in 6 weeks we will consider an injection.

## 2023-01-05 NOTE — Progress Notes (Signed)
    Procedures performed today:    None.  Independent interpretation of notes and tests performed by another provider:   None.  Brief History, Exam, Impression, and Recommendations:    Primary osteoarthritis of both knees Known bilateral knee osteoarthritis, moderate pain. Diclofenac is giving her some constipation, she will switch to arthritis strength Tylenol, I would like PT to work on her knee, if not better in 6 weeks we will consider an injection.  Achilles tendinitis, right leg Also having 3 months of pain right posterior ankle, on exam pain is localized to the Achilles insertion, no calcaneal the retrocalcaneal bursitis. Negative Thompson's test, good motion and good strength. We discussed the treatment protocol including a boot immobilization for 2 to 3 weeks followed by heel lift in both shoes, eccentric formal physical therapy and topical nitroglycerin. Return to see me in approximately 6 weeks.    ____________________________________________ Ihor Austin. Benjamin Stain, M.D., ABFM., CAQSM., AME. Primary Care and Sports Medicine [facility]  Adjunct Professor of Family Medicine  [employer]  Restaurant manager, fast food

## 2023-01-05 NOTE — Assessment & Plan Note (Signed)
Also having 3 months of pain right posterior ankle, on exam pain is localized to the Achilles insertion, no calcaneal the retrocalcaneal bursitis. Negative Thompson's test, good motion and good strength. We discussed the treatment protocol including a boot immobilization for 2 to 3 weeks followed by heel lift in both shoes, eccentric formal physical therapy and topical nitroglycerin. Return to see me in approximately 6 weeks.

## 2023-01-12 ENCOUNTER — Ambulatory Visit: Payer: BC Managed Care – PPO | Admitting: Sports Medicine

## 2023-01-24 ENCOUNTER — Ambulatory Visit: Payer: BC Managed Care – PPO | Attending: Sports Medicine | Admitting: Physical Therapy

## 2023-01-24 ENCOUNTER — Encounter: Payer: Self-pay | Admitting: Physical Therapy

## 2023-01-24 ENCOUNTER — Other Ambulatory Visit: Payer: Self-pay

## 2023-01-24 DIAGNOSIS — M170 Bilateral primary osteoarthritis of knee: Secondary | ICD-10-CM | POA: Diagnosis present

## 2023-01-24 DIAGNOSIS — M25571 Pain in right ankle and joints of right foot: Secondary | ICD-10-CM | POA: Insufficient documentation

## 2023-01-24 DIAGNOSIS — R262 Difficulty in walking, not elsewhere classified: Secondary | ICD-10-CM | POA: Diagnosis not present

## 2023-01-24 DIAGNOSIS — G8929 Other chronic pain: Secondary | ICD-10-CM | POA: Insufficient documentation

## 2023-01-24 NOTE — Therapy (Signed)
 OUTPATIENT PHYSICAL THERAPY LOWER EXTREMITY EVALUATION   Patient Name: Tracy [name] MRN: [id number] DOB:[birth year], 58 y.o., female Today's Date: 01/24/2023  END OF SESSION:  PT End of Session - 01/24/23 1617     Visit Number 1    Number of Visits 16    Date for PT Re-Evaluation 03/21/23    Authorization Type BCBS    PT Start Time 1530    PT Stop Time 1610    PT Time Calculation (min) 40 min    Activity Tolerance Patient tolerated treatment well    Behavior During Therapy [facility] for tasks assessed/performed             Past Medical History:  Diagnosis Date   Hypertension    Past Surgical History:  Procedure Laterality Date   TOE SURGERY     WISDOM TOOTH EXTRACTION     Patient Active Problem List   Diagnosis Date Noted   Achilles tendinitis, right leg 01/05/2023   Seasonal allergies 05/02/2021   Primary osteoarthritis of left hip 07/07/2020   Primary osteoarthritis of both knees 10/05/2015   Lumbar degenerative disc disease 10/05/2015   Left cervical radiculopathy 01/22/2015    PCP: Loring Leeds  REFERRING PROVIDER: Curtis  REFERRING DIAG: Rt knee OA, Rt achilles tendinosis  THERAPY DIAG:  Pain in right ankle and joints of right foot  Difficulty in walking, not elsewhere classified  Chronic pain of both knees  Rationale for Evaluation and Treatment: Rehabilitation  ONSET DATE: October 2024  SUBJECTIVE:   SUBJECTIVE STATEMENT: Pt states that in October she was carrying something heavy and her knee popped and she developed a baker's cyst in her Rt knee. After the knee injury her Rt foot began hurting and she finally saw Dr. ONEIDA at the end of December who diagnosed her with Rt achilles tendonitis and prescribed a walking boot. She states she has a few days left to wear the boot. She states that her Lt knee began hurting about 2 weeks ago but that her Rt knee is feeling better. Rt ankle and Lt knee pain increase with stair negotiation, incline/decline,  pain decrease with rest and meds.  PERTINENT HISTORY: History of knee pain, OA PAIN:  Are you having pain? Yes: NPRS scale: 4-5/10 in Rt foot, 6/10 Lt knee Pain location: Rt foot, Lt knee Pain description: sore, achey Aggravating factors: stairs, inclines Relieving factors: rest, meds  PRECAUTIONS: None  RED FLAGS: None   WEIGHT BEARING RESTRICTIONS: No  FALLS:  Has patient fallen in last 6 months? 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  DIAGNOSTIC FINDINGS:  Rt ankle x ray: 1. Moderate size fragmented Achilles tendon enthesophyte with mild associated soft tissue thickening. 2. Moderate to large fragmented plantar calcaneal spur. 3. Mild subtalar osteoarthritis.  PATIENT SURVEYS:  FOTO 48  COGNITION: Overall cognitive status: Within functional limits for tasks assessed     SENSATION: WFL  EDEMA:  +1 edema Lt knee   PALPATION: No TTP Rt ankle or Lt knee   LOWER EXTREMITY ROM:  Active ROM Right eval Left eval  Hip flexion    Hip extension    Hip abduction    Hip adduction    Hip internal rotation    Hip external rotation decreased decreased  Knee  flexion 115 104  Knee extension    Ankle dorsiflexion 0 6  Ankle plantarflexion 45 55  Ankle inversion 10 25  Ankle eversion 25 31   (Blank rows = not tested)  LOWER EXTREMITY MMT:  MMT Right eval Left eval  Hip flexion 4- 4-  Hip extension    Hip abduction 4 4  Hip adduction    Hip internal rotation    Hip external rotation    Knee flexion 4- 4-  Knee extension 4 4  Ankle dorsiflexion 4 4  Ankle plantarflexion 4 4  Ankle inversion 3+ 4  Ankle eversion 3+ 4   (Blank rows = not tested)   GAIT: Distance walked: 100' Assistive device utilized: None Level of  assistance: Complete Independence Comments: decreased cadence                                                                                                                                TREATMENT DATE: 01/24/23     PATIENT EDUCATION:  Education details: PT POC and goals, HEP Person educated: Patient Education method: Programmer, Multimedia, Demonstration, and Handouts  Education comprehension: verbalized understanding, returned demonstration, and needs further education  HOME EXERCISE PROGRAM: Access Code: [email or web address] URL: [email or web address] Date: 01/24/2023 Prepared by: Darice Conine  Exercises - Gastroc Stretch on Wall  - 1 x daily - 7 x weekly - 1 sets - 3 reps - 20-30 seconds hold - Soleus Stretch on Wall  - 1 x daily - 7 x weekly - 1 sets - 3 reps - 20-30 seconds hold - Supine Hip External Rotation Stretch  - 1 x daily - 7 x weekly - 1 sets - 3 reps - 20-30 seconds hold - Ankle Inversion with Resistance  - 1 x daily - 7 x weekly - 3 sets - 10 reps - Ankle Eversion with Resistance  - 1 x daily - 7 x weekly - 3 sets - 10 reps  ASSESSMENT:  CLINICAL IMPRESSION: Patient is a 58 y.o. female who was seen today for physical therapy evaluation and treatment for knee pain and ankle pain. She presents with decreased ROM and strength, impaired gait and balance, decreased activity tolerance and increased pain. Pt will benefit from skilled PT to address deficits and improve functional mobility.   OBJECTIVE IMPAIRMENTS: decreased activity tolerance, decreased balance, difficulty walking, decreased ROM, decreased strength, and pain.   ACTIVITY LIMITATIONS: standing, stairs, and locomotion level  PARTICIPATION LIMITATIONS: cleaning and community activity  PERSONAL FACTORS: Time since onset of injury/illness/exacerbation are also affecting patient's functional outcome.   REHAB POTENTIAL: Good  CLINICAL DECISION MAKING: Stable/uncomplicated  EVALUATION COMPLEXITY:  Low   GOALS: Goals reviewed with patient? Yes  SHORT TERM GOALS: Target date: 02/21/2023   Pt will be independent with initial HEP Baseline: Goal status: INITIAL  2.  Pt will improve FOTO to >= 55 to demo improving functional mobility Baseline:  Goal status: INITIAL  3.  Pt will improve  Rt ankle DF ROM to 5 degrees to improve stair negotiation Baseline:  Goal status: INITIAL   LONG TERM GOALS: Target date: 03/21/2023   Pt will be independent with advanced HEP Baseline:  Goal status: INITIAL  2.  Pt will improve FOTO to >= 63 to demo improved functional mobility Baseline:  Goal status: INITIAL  3.  Pt will improve LE strength to 4+/5 to improve standing and walking tolerance Baseline:  Goal status: INITIAL  4.  Pt will walk x 10 minutes with ankle and knee pain <= 2/10 Baseline:  Goal status: INITIAL  5.  Pt will negotiate stairs with reciprocal pattern with pain <= 2/10 Baseline:  Goal status: INITIAL     PLAN:  PT FREQUENCY: 2x/week  PT DURATION: 8 weeks  PLANNED INTERVENTIONS: 97164- PT Re-evaluation, 97110-Therapeutic exercises, 97530- Therapeutic activity, 97112- Neuromuscular re-education, 97535- Self Care, [id number]- Manual therapy, [id number]- Gait training, [phone]- Aquatic Therapy, 97014- Electrical stimulation (unattended), 97016- Vasopneumatic device, [id number]- Ionotophoresis 4mg /ml Dexamethasone, Patient/Family education, Balance training, Stair training, Taping, Dry Needling, Moist heat, and Biofeedback  PLAN FOR NEXT SESSION: assess response to HEP, add standing knee and ankle strength and balance   Shaterria Sager, PT 01/24/2023, 4:18 PM

## 2023-01-30 ENCOUNTER — Ambulatory Visit: Payer: BC Managed Care – PPO

## 2023-01-30 DIAGNOSIS — M25571 Pain in right ankle and joints of right foot: Secondary | ICD-10-CM

## 2023-01-30 DIAGNOSIS — M170 Bilateral primary osteoarthritis of knee: Secondary | ICD-10-CM | POA: Diagnosis not present

## 2023-01-30 DIAGNOSIS — R262 Difficulty in walking, not elsewhere classified: Secondary | ICD-10-CM

## 2023-01-30 DIAGNOSIS — G8929 Other chronic pain: Secondary | ICD-10-CM

## 2023-01-30 NOTE — Therapy (Signed)
 OUTPATIENT PHYSICAL THERAPY LOWER EXTREMITY TREATMENT   Patient Name: Tracy [name] MRN: [id number] DOB:1965-01-17, 58 y.o., female Today's Date: 01/30/2023  END OF SESSION:  PT End of Session - 01/30/23 0801     Visit Number 2    Number of Visits 16    Date for PT Re-Evaluation 03/21/23    Authorization Type BCBS    PT Start Time 0800    PT Stop Time 0845    PT Time Calculation (min) 45 min    Activity Tolerance Patient tolerated treatment well    Behavior During Therapy [facility] for tasks assessed/performed            Past Medical History:  Diagnosis Date   Hypertension    Past Surgical History:  Procedure Laterality Date   TOE SURGERY     WISDOM TOOTH EXTRACTION     Patient Active Problem List   Diagnosis Date Noted   Achilles tendinitis, right leg 01/05/2023   Seasonal allergies 05/02/2021   Primary osteoarthritis of left hip 07/07/2020   Primary osteoarthritis of both knees 10/05/2015   Lumbar degenerative disc disease 10/05/2015   Left cervical radiculopathy 01/22/2015    PCP: Loring Leeds  REFERRING PROVIDER: Curtis  REFERRING DIAG: Rt knee OA, Rt achilles tendinosis  THERAPY DIAG:  Pain in right ankle and joints of right foot  Difficulty in walking, not elsewhere classified  Chronic pain of both knees  Rationale for Evaluation and Treatment: Rehabilitation  ONSET DATE: October 2024  SUBJECTIVE:   SUBJECTIVE STATEMENT: Patient reports she is on day 2 out of the boot, states she has been easing back with walking.   EVAL: Pt states that in October she was carrying something heavy and her knee popped and she developed a baker's cyst in her Rt knee. After the knee injury her Rt foot began hurting and she finally saw Dr. ONEIDA at the end of December who diagnosed her with Rt achilles tendonitis and prescribed a walking boot. She states she has a few days left to wear the boot. She states that her Lt knee began hurting about 2 weeks ago but that her  Rt knee is feeling better. Rt ankle and Lt knee pain increase with stair negotiation, incline/decline, pain decrease with rest and meds.  PERTINENT HISTORY: History of knee pain, OA PAIN:  Are you having pain? Yes: NPRS scale: 4-5/10 in Rt foot, 6/10 Lt knee Pain location: Rt foot, Lt knee Pain description: sore, achey Aggravating factors: stairs, inclines Relieving factors: rest, meds  PRECAUTIONS: None  RED FLAGS: None   WEIGHT BEARING RESTRICTIONS: No  FALLS:  Has patient fallen in last 6 months? 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  DIAGNOSTIC FINDINGS:  Rt ankle x ray: 1. Moderate size fragmented Achilles tendon enthesophyte with mild associated soft tissue thickening. 2. Moderate to large fragmented plantar calcaneal spur. 3. Mild subtalar osteoarthritis.  PATIENT SURVEYS:  FOTO 48  COGNITION: Overall cognitive status: Within functional limits for tasks assessed     SENSATION: WFL  EDEMA:  +1 edema Lt knee   PALPATION: No TTP Rt ankle or Lt knee   LOWER EXTREMITY ROM:  Active ROM Right eval Left eval  Hip flexion    Hip extension    Hip abduction  Hip adduction    Hip internal rotation    Hip external rotation decreased decreased  Knee flexion 115 104  Knee extension    Ankle dorsiflexion 0 6  Ankle plantarflexion 45 55  Ankle inversion 10 25  Ankle eversion 25 31   (Blank rows = not tested)  LOWER EXTREMITY MMT:  MMT Right eval Left eval  Hip flexion 4- 4-  Hip extension    Hip abduction 4 4  Hip adduction    Hip internal rotation    Hip external rotation    Knee flexion 4- 4-  Knee extension 4 4  Ankle dorsiflexion 4 4  Ankle plantarflexion 4 4  Ankle inversion 3+ 4  Ankle eversion 3+ 4    (Blank rows = not tested)   GAIT: Distance walked: 100' Assistive device utilized: None Level of assistance: Complete Independence Comments: decreased cadence   [facility] Adult PT Treatment:                                            DATE: 01/30/2023 Therapeutic Exercise: Gastroc & soleus stretches x30 each (B) Heel raises x10 Seated: Resisted ankle eversion/inversion + GTB x15 each (B) Ankle ever/inv AROM Towel scrunch + foot splay Foot doming (didn't feel much) Resisted soleus heel raises + 15#KB resting on knee Supine figure 4 hip ER stretch S/L straight leg hip abd 2x10 (B) S/L small circles in abd CW/CCW Supine HS/ITB stretches w/strap x30 each Resisted ankle DF/PF + GTB x10 each Standing hip abd & ext x10 each (B) 6 step up/down (reciprocal pattern) x10 leading each leg                                                                                                                                PATIENT EDUCATION:  Education details: Updated HEP Person educated: Patient Education method: Explanation, Demonstration, and Handouts  Education comprehension: verbalized understanding, returned demonstration, and needs further education  HOME EXERCISE PROGRAM: Access Code: [email or web address] URL: [email or web address] Date: 01/30/2023 Prepared by: Lamarr Price  Exercises - Gastroc Stretch on Wall  - 1 x daily - 7 x weekly - 1 sets - 3 reps - 20-30 seconds hold - Soleus Stretch on Wall  - 1 x daily - 7 x weekly - 1 sets - 3 reps - 20-30 seconds hold - Supine Hip External Rotation Stretch  - 1 x daily - 7 x weekly - 1 sets - 3 reps - 20-30 seconds hold - Ankle Inversion with Resistance  - 1 x daily - 7 x weekly - 3 sets - 10 reps - Ankle Eversion with Resistance  - 1 x daily - 7 x weekly - 3 sets - 10 reps - Towel Scrunches  - 1 x daily - 7 x weekly - 3 sets - 10 reps - Ankle Dorsiflexion with  Resistance  - 1 x daily - 7 x weekly - 3 sets - 10 reps - Ankle and Toe  Plantarflexion with Resistance  - 1 x daily - 7 x weekly - 3 sets - 10 reps - Sidelying Hip Abduction  - 1 x daily - 7 x weekly - 3 sets - 10 reps - Standing Heel Raises  - 1 x daily - 7 x weekly - 3 sets - 10 reps  ASSESSMENT:  CLINICAL IMPRESSION: Ankle strengthening progressed with seated and standing exercises. Noted bilateral glue weakness with side lying hip abduction variations. Minimal pain towards end of stair step ups reported along L medial knee.    OBJECTIVE IMPAIRMENTS: decreased activity tolerance, decreased balance, difficulty walking, decreased ROM, decreased strength, and pain.   ACTIVITY LIMITATIONS: standing, stairs, and locomotion level  PARTICIPATION LIMITATIONS: cleaning and community activity  PERSONAL FACTORS: Time since onset of injury/illness/exacerbation are also affecting patient's functional outcome.   REHAB POTENTIAL: Good  CLINICAL DECISION MAKING: Stable/uncomplicated  EVALUATION COMPLEXITY: Low   GOALS: Goals reviewed with patient? Yes  SHORT TERM GOALS: Target date: 02/21/2023  Pt will be independent with initial HEP Baseline: Goal status: INITIAL  2.  Pt will improve FOTO to >= 55 to demo improving functional mobility Baseline:  Goal status: INITIAL  3.  Pt will improve Rt ankle DF ROM to 5 degrees to improve stair negotiation Baseline:  Goal status: INITIAL   LONG TERM GOALS: Target date: 03/21/2023  Pt will be independent with advanced HEP Baseline:  Goal status: INITIAL  2.  Pt will improve FOTO to >= 63 to demo improved functional mobility Baseline:  Goal status: INITIAL  3.  Pt will improve LE strength to 4+/5 to improve standing and walking tolerance Baseline:  Goal status: INITIAL  4.  Pt will walk x 10 minutes with ankle and knee pain <= 2/10 Baseline:  Goal status: INITIAL  5.  Pt will negotiate stairs with reciprocal pattern with pain <= 2/10 Baseline:  Goal status: INITIAL   PLAN:  PT FREQUENCY:  2x/week  PT DURATION: 8 weeks  PLANNED INTERVENTIONS: 97164- PT Re-evaluation, 97110-Therapeutic exercises, 97530- Therapeutic activity, 97112- Neuromuscular re-education, 97535- Self Care, 02859- Manual therapy, [id number]- Gait training, [phone]- Aquatic Therapy, 97014- Electrical stimulation (unattended), 97016- Vasopneumatic device, [phone]- Ionotophoresis 4mg /ml Dexamethasone, Patient/Family education, Balance training, Stair training, Taping, Dry Needling, Moist heat, and Biofeedback  PLAN FOR NEXT SESSION: Progress standing knee and ankle strength and balance, walking endurance, stairs   Lamarr GORMAN Price, PTA 01/30/2023, 8:46 AM

## 2023-02-01 ENCOUNTER — Ambulatory Visit: Payer: BC Managed Care – PPO

## 2023-02-01 DIAGNOSIS — R262 Difficulty in walking, not elsewhere classified: Secondary | ICD-10-CM

## 2023-02-01 DIAGNOSIS — G8929 Other chronic pain: Secondary | ICD-10-CM

## 2023-02-01 DIAGNOSIS — M170 Bilateral primary osteoarthritis of knee: Secondary | ICD-10-CM | POA: Diagnosis not present

## 2023-02-01 DIAGNOSIS — M25571 Pain in right ankle and joints of right foot: Secondary | ICD-10-CM

## 2023-02-01 NOTE — Therapy (Signed)
OUTPATIENT PHYSICAL THERAPY LOWER EXTREMITY TREATMENT   Patient Name: Mechelle Vanharen MRN: [id number] DOB:[birth year], 58 y.o., female Today's Date: 02/01/2023  END OF SESSION:  PT End of Session - 02/01/23 1618     Visit Number 3    Number of Visits 16    Date for PT Re-Evaluation 03/21/23    Authorization Type BCBS    PT Start Time 1620    PT Stop Time 1700    PT Time Calculation (min) 40 min    Activity Tolerance Patient tolerated treatment well    Behavior During Therapy [facility] for tasks assessed/performed             Past Medical History:  Diagnosis Date   Hypertension    Past Surgical History:  Procedure Laterality Date   TOE SURGERY     WISDOM TOOTH EXTRACTION     Patient Active Problem List   Diagnosis Date Noted   Achilles tendinitis, right leg 01/05/2023   Seasonal allergies 05/02/2021   Primary osteoarthritis of left hip 07/07/2020   Primary osteoarthritis of both knees 10/05/2015   Lumbar degenerative disc disease 10/05/2015   Left cervical radiculopathy 01/22/2015    PCP: Venia Minks  REFERRING PROVIDER: Benjamin Stain  REFERRING DIAG: Rt knee OA, Rt achilles tendinosis  THERAPY DIAG:  Pain in right ankle and joints of right foot  Difficulty in walking, not elsewhere classified  Chronic pain of both knees  Rationale for Evaluation and Treatment: Rehabilitation  ONSET DATE: October 2024  SUBJECTIVE:   SUBJECTIVE STATEMENT: Patient returned to the clinic and reviewed her chief complaints. She is getting used to walking without the boot but still gets some pain at the R Achilles. The L knee continues to bother her in the medial aspect, particularly with stairs or prolonged walking.  EVAL: Pt states that in October she was carrying something heavy and her knee "popped" and she developed a baker's cyst in her Rt knee. After the knee injury her Rt foot began hurting and she finally saw Dr. Karie Schwalbe at the end of December who diagnosed her with Rt achilles  tendonitis and prescribed a walking boot. She states she has a few days left to wear the boot. She states that her Lt knee began hurting about 2 weeks ago but that her Rt knee is feeling better. Rt ankle and Lt knee pain increase with stair negotiation, incline/decline, pain decrease with rest and meds.  PERTINENT HISTORY: History of knee pain, OA PAIN:  Are you having pain? Yes: NPRS scale: 4-5/10 in Rt foot, 6/10 Lt knee Pain location: Rt foot, Lt knee Pain description: sore, achey Aggravating factors: stairs, inclines Relieving factors: rest, meds  PRECAUTIONS: None  RED FLAGS: None   WEIGHT BEARING RESTRICTIONS: No  FALLS:  Has patient fallen in last 6 months? 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  DIAGNOSTIC FINDINGS:  Rt ankle x ray: 1. Moderate size fragmented Achilles tendon enthesophyte with mild associated soft tissue thickening. 2. Moderate to large fragmented plantar calcaneal spur. 3. Mild subtalar osteoarthritis.  PATIENT SURVEYS:  FOTO 48  COGNITION: Overall cognitive status: Within functional limits for tasks assessed     SENSATION: WFL  EDEMA:  +1 edema Lt knee   PALPATION: No TTP Rt ankle or Lt  knee   LOWER EXTREMITY ROM:  Active ROM Right eval Left eval  Hip flexion    Hip extension    Hip abduction    Hip adduction    Hip internal rotation    Hip external rotation decreased decreased  Knee flexion 115 104  Knee extension    Ankle dorsiflexion 0 6  Ankle plantarflexion 45 55  Ankle inversion 10 25  Ankle eversion 25 31   (Blank rows = not tested)  LOWER EXTREMITY MMT:  MMT Right eval Left eval  Hip flexion 4- 4-  Hip extension    Hip abduction 4 4  Hip adduction    Hip internal rotation     Hip external rotation    Knee flexion 4- 4-  Knee extension 4 4  Ankle dorsiflexion 4 4  Ankle plantarflexion 4 4  Ankle inversion 3+ 4  Ankle eversion 3+ 4   (Blank rows = not tested)   GAIT: Distance walked: 100' Assistive device utilized: None Level of assistance: Complete Independence Comments: decreased cadence  [facility] Adult PT Treatment:                                            DATE: 02/01/2023 Manual Therapy: Supine: Instrument assisted soft tissue mobilization to R calf and Achilles tendon Instrument assisted soft tissue mobilization to L medial calf, pes anserinus region, and adductor region L AP tibial glides near end-range L knee flexion L PA glides (towel roll in popliteal region) mobilization with movement into L knee flexion Therapeutic Exercise: Supine: Manual calf stretch, +/- toe extension, including talocrural and subtalar joints                                                                                                   [facility] Adult PT Treatment:                                            DATE: 01/30/2023 Therapeutic Exercise: Gastroc & soleus stretches x30" each (B) Heel raises x10 Seated: Resisted ankle eversion/inversion + GTB x15 each (B) Ankle ever/inv AROM Towel scrunch + foot splay Foot doming (didn't feel much) Resisted soleus heel raises + 15#KB resting on knee Supine figure 4 hip ER stretch S/L straight leg hip abd 2x10 (B) S/L small circles in abd CW/CCW Supine HS/ITB stretches w/strap x30" each Resisted ankle DF/PF + GTB x10 each Standing hip abd & ext x10 each (B) 6" step up/down (reciprocal pattern) x10 leading each leg  PATIENT EDUCATION:  Education details: Updated HEP Person educated: Patient Education method: Explanation, Demonstration, and Handouts  Education comprehension: verbalized understanding,  returned demonstration, and needs further education  HOME EXERCISE PROGRAM: Access Code: [email or web address] URL: [email or web address] Date: 01/30/2023 Prepared by: Carlynn Herald  Exercises - Gastroc Stretch on Wall  - 1 x daily - 7 x weekly - 1 sets - 3 reps - 20-30 seconds hold - Soleus Stretch on Wall  - 1 x daily - 7 x weekly - 1 sets - 3 reps - 20-30 seconds hold - Supine Hip External Rotation Stretch  - 1 x daily - 7 x weekly - 1 sets - 3 reps - 20-30 seconds hold - Ankle Inversion with Resistance  - 1 x daily - 7 x weekly - 3 sets - 10 reps - Ankle Eversion with Resistance  - 1 x daily - 7 x weekly - 3 sets - 10 reps - Towel Scrunches  - 1 x daily - 7 x weekly - 3 sets - 10 reps - Ankle Dorsiflexion with Resistance  - 1 x daily - 7 x weekly - 3 sets - 10 reps - Ankle and Toe Plantarflexion with Resistance  - 1 x daily - 7 x weekly - 3 sets - 10 reps - Sidelying Hip Abduction  - 1 x daily - 7 x weekly - 3 sets - 10 reps - Standing Heel Raises  - 1 x daily - 7 x weekly - 3 sets - 10 reps  ASSESSMENT:  CLINICAL IMPRESSION: Focused on soft tissue restrictions at R and L calf and L pes anserinus/adductor regions which reproduced chief complaints. Also focused on restricted R ankle dorsiflexion and L knee flexion. Patient noted improvement in symptoms with walking at end of session. Physical therapy remains indicated.   OBJECTIVE IMPAIRMENTS: decreased activity tolerance, decreased balance, difficulty walking, decreased ROM, decreased strength, and pain.   ACTIVITY LIMITATIONS: standing, stairs, and locomotion level  PARTICIPATION LIMITATIONS: cleaning and community activity  PERSONAL FACTORS: Time since onset of injury/illness/exacerbation are also affecting patient's functional outcome.   REHAB POTENTIAL: Good  CLINICAL DECISION MAKING: Stable/uncomplicated  EVALUATION COMPLEXITY: Low   GOALS: Goals reviewed with patient? Yes  SHORT TERM GOALS: Target date:  02/21/2023  Pt will be independent with initial HEP Baseline: Goal status: INITIAL  2.  Pt will improve FOTO to >= 55 to demo improving functional mobility Baseline:  Goal status: INITIAL  3.  Pt will improve Rt ankle DF ROM to 5 degrees to improve stair negotiation Baseline:  Goal status: INITIAL   LONG TERM GOALS: Target date: 03/21/2023  Pt will be independent with advanced HEP Baseline:  Goal status: INITIAL  2.  Pt will improve FOTO to >= 63 to demo improved functional mobility Baseline:  Goal status: INITIAL  3.  Pt will improve LE strength to 4+/5 to improve standing and walking tolerance Baseline:  Goal status: INITIAL  4.  Pt will walk x 10 minutes with ankle and knee pain <= 2/10 Baseline:  Goal status: INITIAL  5.  Pt will negotiate stairs with reciprocal pattern with pain <= 2/10 Baseline:  Goal status: INITIAL   PLAN:  PT FREQUENCY: 2x/week  PT DURATION: 8 weeks  PLANNED INTERVENTIONS: 97164- PT Re-evaluation, 97110-Therapeutic exercises, 97530- Therapeutic activity, 97112- Neuromuscular re-education, 97535- Self Care, 82956- Manual therapy, [id number]- Gait training, [phone]- Aquatic Therapy, 97014- Electrical stimulation (unattended), 97016- Vasopneumatic device, [id number]- Ionotophoresis 4mg /ml Dexamethasone, Patient/Family education, Balance training, Stair training, Taping, Dry Needling, Moist heat, and  Biofeedback  PLAN FOR NEXT SESSION: Progress standing knee and ankle strength and balance, walking endurance, stairs   Clelia Schaumann, PT 02/01/2023, 5:14 PM

## 2023-02-02 ENCOUNTER — Other Ambulatory Visit: Payer: Self-pay | Admitting: Sports Medicine

## 2023-02-06 ENCOUNTER — Ambulatory Visit: Payer: BC Managed Care – PPO | Admitting: Physical Therapy

## 2023-02-06 ENCOUNTER — Encounter: Payer: Self-pay | Admitting: Physical Therapy

## 2023-02-06 DIAGNOSIS — M170 Bilateral primary osteoarthritis of knee: Secondary | ICD-10-CM | POA: Diagnosis not present

## 2023-02-06 DIAGNOSIS — R262 Difficulty in walking, not elsewhere classified: Secondary | ICD-10-CM

## 2023-02-06 DIAGNOSIS — G8929 Other chronic pain: Secondary | ICD-10-CM

## 2023-02-06 DIAGNOSIS — M25571 Pain in right ankle and joints of right foot: Secondary | ICD-10-CM

## 2023-02-06 NOTE — Therapy (Signed)
OUTPATIENT PHYSICAL THERAPY LOWER EXTREMITY TREATMENT   Patient Name: Tracy [name] MRN: [id number] DOB:1965-11-18, 58 y.o., female Today's Date: 02/06/2023  END OF SESSION:  PT End of Session - 02/06/23 1608     Visit Number 4    Number of Visits 16    Date for PT Re-Evaluation 03/21/23    Authorization Type BCBS    PT Start Time 1525    PT Stop Time 1608    PT Time Calculation (min) 43 min    Activity Tolerance Patient tolerated treatment well    Behavior During Therapy [facility] for tasks assessed/performed              Past Medical History:  Diagnosis Date   Hypertension    Past Surgical History:  Procedure Laterality Date   TOE SURGERY     WISDOM TOOTH EXTRACTION     Patient Active Problem List   Diagnosis Date Noted   Achilles tendinitis, right leg 01/05/2023   Seasonal allergies 05/02/2021   Primary osteoarthritis of left hip 07/07/2020   Primary osteoarthritis of both knees 10/05/2015   Lumbar degenerative disc disease 10/05/2015   Left cervical radiculopathy 01/22/2015    PCP: Venia Minks  REFERRING PROVIDER: Benjamin Stain  REFERRING DIAG: Rt knee OA, Rt achilles tendinosis  THERAPY DIAG:  Pain in right ankle and joints of right foot  Difficulty in walking, not elsewhere classified  Chronic pain of both knees  Rationale for Evaluation and Treatment: Rehabilitation  ONSET DATE: October 2024  SUBJECTIVE:   SUBJECTIVE STATEMENT: Patient reports she felt the manual work made her sore at first but then she felt better overall. Her knee continues to feel better but her Rt foot continues to bother her  EVAL: Pt states that in October she was carrying something heavy and her knee "popped" and she developed a baker's cyst in her Rt knee. After the knee injury her Rt foot began hurting and she finally saw Dr. Karie Schwalbe at the end of December who diagnosed her with Rt achilles tendonitis and prescribed a walking boot. She states she has a few days left to wear the  boot. She states that her Lt knee began hurting about 2 weeks ago but that her Rt knee is feeling better. Rt ankle and Lt knee pain increase with stair negotiation, incline/decline, pain decrease with rest and meds.  PERTINENT HISTORY: History of knee pain, OA PAIN:  Are you having pain? Yes: NPRS scale: 4-5/10 in Rt foot, 0/10 Lt knee Pain location: Rt foot, Lt knee Pain description: sore, achey Aggravating factors: stairs, inclines Relieving factors: rest, meds  PRECAUTIONS: None  RED FLAGS: None   WEIGHT BEARING RESTRICTIONS: No  FALLS:  Has patient fallen in last 6 months? 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  DIAGNOSTIC FINDINGS:  Rt ankle x ray: 1. Moderate size fragmented Achilles tendon enthesophyte with mild associated soft tissue thickening. 2. Moderate to large fragmented plantar calcaneal spur. 3. Mild subtalar osteoarthritis.  PATIENT SURVEYS:  FOTO 48   EDEMA:  +1 edema Lt knee   PALPATION: No TTP Rt ankle or Lt knee   LOWER EXTREMITY ROM:  Active ROM Right eval Left eval  Hip flexion    Hip extension    Hip abduction  Hip adduction    Hip internal rotation    Hip external rotation decreased decreased  Knee flexion 115 104  Knee extension    Ankle dorsiflexion 0 6  Ankle plantarflexion 45 55  Ankle inversion 10 25  Ankle eversion 25 31   (Blank rows = not tested)  LOWER EXTREMITY MMT:  MMT Right eval Left eval  Hip flexion 4- 4-  Hip extension    Hip abduction 4 4  Hip adduction    Hip internal rotation    Hip external rotation    Knee flexion 4- 4-  Knee extension 4 4  Ankle dorsiflexion 4 4  Ankle plantarflexion 4 4  Ankle inversion 3+ 4  Ankle eversion 3+ 4   (Blank rows = not  tested)   GAIT: Distance walked: 100' Assistive device utilized: None Level of assistance: Complete Independence Comments: decreased cadence  [facility] Adult PT Treatment:                                                DATE: 02/06/23 Therapeutic Exercise: Nustep L6 x 5 min for warm up Gastroc stretch x 30 seconds Soleus stretch x 30 seconds Seated toe raises for strength and ROM x 20 Ankle inv/eversion green TB x 20 Soleus heel raise 15# KB on knee x 20 Towel scrunch x 1 min Toe splay x 15 BAPS L2 CW/CCW x 10 Heel raises x 10, toes in x 5, toes out x 5 Step ups x 10 6'' step with light UE support Manual Therapy: STM and IASTM Rt calf and achilles tendon Passive Rt calf and great toe stretch to improve mobility   [facility] Adult PT Treatment:                                            DATE: 02/01/2023 Manual Therapy: Supine: Instrument assisted soft tissue mobilization to R calf and Achilles tendon Instrument assisted soft tissue mobilization to L medial calf, pes anserinus region, and adductor region L AP tibial glides near end-range L knee flexion L PA glides (towel roll in popliteal region) mobilization with movement into L knee flexion Therapeutic Exercise: Supine: Manual calf stretch, +/- toe extension, including talocrural and subtalar joints                                                                                                   [facility] Adult PT Treatment:                                            DATE: 01/30/2023 Therapeutic Exercise: Gastroc & soleus stretches x30" each (B) Heel raises x10 Seated: Resisted ankle eversion/inversion + GTB x15 each (B) Ankle ever/inv AROM Towel scrunch + foot splay Foot  doming (didn't feel much) Resisted soleus heel raises + 15#KB resting on knee Supine figure 4 hip ER stretch S/L straight leg hip abd 2x10 (B) S/L small circles in abd CW/CCW Supine HS/ITB stretches w/strap x30" each Resisted ankle DF/PF + GTB x10 each Standing hip  abd & ext x10 each (B) 6" step up/down (reciprocal pattern) x10 leading each leg                                                                                                                              PATIENT EDUCATION:  Education details: Updated HEP Person educated: Patient Education method: Explanation, Demonstration, and Handouts  Education comprehension: verbalized understanding, returned demonstration, and needs further education  HOME EXERCISE PROGRAM: Access Code: [email or web address] URL: [email or web address] Date: 01/30/2023 Prepared by: Carlynn Herald  Exercises - Gastroc Stretch on Wall  - 1 x daily - 7 x weekly - 1 sets - 3 reps - 20-30 seconds hold - Soleus Stretch on Wall  - 1 x daily - 7 x weekly - 1 sets - 3 reps - 20-30 seconds hold - Supine Hip External Rotation Stretch  - 1 x daily - 7 x weekly - 1 sets - 3 reps - 20-30 seconds hold - Ankle Inversion with Resistance  - 1 x daily - 7 x weekly - 3 sets - 10 reps - Ankle Eversion with Resistance  - 1 x daily - 7 x weekly - 3 sets - 10 reps - Towel Scrunches  - 1 x daily - 7 x weekly - 3 sets - 10 reps - Ankle Dorsiflexion with Resistance  - 1 x daily - 7 x weekly - 3 sets - 10 reps - Ankle and Toe Plantarflexion with Resistance  - 1 x daily - 7 x weekly - 3 sets - 10 reps - Sidelying Hip Abduction  - 1 x daily - 7 x weekly - 3 sets - 10 reps - Standing Heel Raises  - 1 x daily - 7 x weekly - 3 sets - 10 reps  ASSESSMENT:  CLINICAL IMPRESSION: Treatment focused on ankle and foot strength and mobility. Pt with good tolerance to manual work and and exercises. Added BAPs board to improve coordination and mobility with good tolerance. Pt still with most difficulty with stair negotiation in bilat knees   OBJECTIVE IMPAIRMENTS: decreased activity tolerance, decreased balance, difficulty walking, decreased ROM, decreased strength, and pain.    GOALS: Goals reviewed with patient? Yes  SHORT TERM GOALS: Target  date: 02/21/2023  Pt will be independent with initial HEP Baseline: Goal status: INITIAL  2.  Pt will improve FOTO to >= 55 to demo improving functional mobility Baseline:  Goal status: INITIAL  3.  Pt will improve Rt ankle DF ROM to 5 degrees to improve stair negotiation Baseline:  Goal status: INITIAL   LONG TERM GOALS: Target date: 03/21/2023  Pt will be independent with advanced HEP Baseline:  Goal status: INITIAL  2.  Pt will improve FOTO to >= 63 to demo improved functional mobility Baseline:  Goal status: INITIAL  3.  Pt will improve LE strength to 4+/5 to improve standing and walking tolerance Baseline:  Goal status: INITIAL  4.  Pt will walk x 10 minutes with ankle and knee pain <= 2/10 Baseline:  Goal status: INITIAL  5.  Pt will negotiate stairs with reciprocal pattern with pain <= 2/10 Baseline:  Goal status: INITIAL   PLAN:  PT FREQUENCY: 2x/week  PT DURATION: 8 weeks  PLANNED INTERVENTIONS: 97164- PT Re-evaluation, 97110-Therapeutic exercises, 97530- Therapeutic activity, 97112- Neuromuscular re-education, 97535- Self Care, 16109- Manual therapy, [id number]- Gait training, [phone]- Aquatic Therapy, 97014- Electrical stimulation (unattended), 97016- Vasopneumatic device, [phone]- Ionotophoresis 4mg /ml Dexamethasone, Patient/Family education, Balance training, Stair training, Taping, Dry Needling, Moist heat, and Biofeedback  PLAN FOR NEXT SESSION: Progress standing knee and ankle strength and balance, walking endurance, stairs   Christiane Sistare, PT 02/06/2023, 4:08 PM

## 2023-02-08 ENCOUNTER — Ambulatory Visit: Payer: BC Managed Care – PPO | Admitting: Physical Therapy

## 2023-02-08 ENCOUNTER — Encounter: Payer: Self-pay | Admitting: Physical Therapy

## 2023-02-08 DIAGNOSIS — M25571 Pain in right ankle and joints of right foot: Secondary | ICD-10-CM

## 2023-02-08 DIAGNOSIS — R262 Difficulty in walking, not elsewhere classified: Secondary | ICD-10-CM

## 2023-02-08 DIAGNOSIS — G8929 Other chronic pain: Secondary | ICD-10-CM

## 2023-02-08 DIAGNOSIS — M170 Bilateral primary osteoarthritis of knee: Secondary | ICD-10-CM | POA: Diagnosis not present

## 2023-02-08 NOTE — Therapy (Signed)
OUTPATIENT PHYSICAL THERAPY LOWER EXTREMITY TREATMENT   Patient Name: Tracy [name] MRN: [id number] DOB:11-05-1965, 59 y.o., female Today's Date: 02/08/2023  END OF SESSION:  PT End of Session - 02/08/23 1138     Visit Number 5    Number of Visits 16    Date for PT Re-Evaluation 03/21/23    Authorization Type BCBS    PT Start Time 1059    PT Stop Time 1138    PT Time Calculation (min) 39 min    Activity Tolerance Patient tolerated treatment well    Behavior During Therapy WFL for tasks assessed/performed               Past Medical History:  Diagnosis Date   Hypertension    Past Surgical History:  Procedure Laterality Date   TOE SURGERY     WISDOM TOOTH EXTRACTION     Patient Active Problem List   Diagnosis Date Noted   Achilles tendinitis, right leg 01/05/2023   Seasonal allergies 05/02/2021   Primary osteoarthritis of left hip 07/07/2020   Primary osteoarthritis of both knees 10/05/2015   Lumbar degenerative disc disease 10/05/2015   Left cervical radiculopathy 01/22/2015    PCP: Venia Minks  REFERRING PROVIDER: Benjamin Stain  REFERRING DIAG: Rt knee OA, Rt achilles tendinosis  THERAPY DIAG:  Pain in right ankle and joints of right foot  Difficulty in walking, not elsewhere classified  Chronic pain of both knees  Rationale for Evaluation and Treatment: Rehabilitation  ONSET DATE: October 2024  SUBJECTIVE:   SUBJECTIVE STATEMENT: Patient reports her right foot is "a teeny bit" better than last visit  EVAL: Pt states that in October she was carrying something heavy and her knee "popped" and she developed a baker's cyst in her Rt knee. After the knee injury her Rt foot began hurting and she finally saw Dr. Karie Schwalbe at the end of December who diagnosed her with Rt achilles tendonitis and prescribed a walking boot. She states she has a few days left to wear the boot. She states that her Lt knee began hurting about 2 weeks ago but that her Rt knee is feeling  better. Rt ankle and Lt knee pain increase with stair negotiation, incline/decline, pain decrease with rest and meds.  PERTINENT HISTORY: History of knee pain, OA PAIN:  Are you having pain? Yes: NPRS scale: 4-5/10 in Rt foot, 0/10 Lt knee Pain location: Rt foot, Lt knee Pain description: sore, achey Aggravating factors: stairs, inclines Relieving factors: rest, meds  PRECAUTIONS: None  RED FLAGS: None   WEIGHT BEARING RESTRICTIONS: No  FALLS:  Has patient fallen in last 6 months? 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  DIAGNOSTIC FINDINGS:  Rt ankle x ray: 1. Moderate size fragmented Achilles tendon enthesophyte with mild associated soft tissue thickening. 2. Moderate to large fragmented plantar calcaneal spur. 3. Mild subtalar osteoarthritis.  PATIENT SURVEYS:  FOTO 48   EDEMA:  +1 edema Lt knee   PALPATION: No TTP Rt ankle or Lt knee   LOWER EXTREMITY ROM:  Active ROM Right eval Left eval  Hip flexion    Hip extension    Hip abduction    Hip adduction    Hip internal rotation    Hip external rotation decreased decreased  Knee flexion 115 104  Knee extension    Ankle dorsiflexion 0 6  Ankle plantarflexion 45 55  Ankle inversion 10 25  Ankle eversion 25 31   (Blank rows = not tested)  LOWER EXTREMITY MMT:  MMT Right eval Left eval  Hip flexion 4- 4-  Hip extension    Hip abduction 4 4  Hip adduction    Hip internal rotation    Hip external rotation    Knee flexion 4- 4-  Knee extension 4 4  Ankle dorsiflexion 4 4  Ankle plantarflexion 4 4  Ankle inversion 3+ 4  Ankle eversion 3+ 4   (Blank rows = not tested)   GAIT: Distance walked: 100' Assistive device utilized: None Level of assistance: Complete  Independence Comments: decreased cadence  [facility] Adult PT Treatment:                                                DATE: 02/08/23 Therapeutic Exercise: Nustep L6 x 5 min for warm up Reciprocal stair negotiation 4'' steps x 10 Lateral step down 2'' step x 10 bilat Standing heel raises x 10 Gastroc stretch x 30 sec Soleus stretch x 30 sec Seated toe raises for strength and ROM x 20 Ankle inv/eversion green TB x 20 Soleus heel raise 15# KB on knee x 20 Toe splay x 15 BAPS L2 CW/CCW x 10 Manual Therapy: STM and IASTM Rt calf and achilles tendon   [facility] Adult PT Treatment:                                                DATE: 02/06/23 Therapeutic Exercise: Nustep L6 x 5 min for warm up Gastroc stretch x 30 seconds Soleus stretch x 30 seconds Seated toe raises for strength and ROM x 20 Ankle inv/eversion green TB x 20 Soleus heel raise 15# KB on knee x 20 Towel scrunch x 1 min Toe splay x 15 BAPS L2 CW/CCW x 10 Heel raises x 10, toes in x 5, toes out x 5 Step ups x 10 6'' step with light UE support Manual Therapy: STM and IASTM Rt calf and achilles tendon Passive Rt calf and great toe stretch to improve mobility   [facility] Adult PT Treatment:                                            DATE: 02/01/2023 Manual Therapy: Supine: Instrument assisted soft tissue mobilization to R calf and Achilles tendon Instrument assisted soft tissue mobilization to L medial calf, pes anserinus region, and adductor region L AP tibial glides near end-range L knee flexion L PA glides (towel roll in popliteal region) mobilization with movement into L knee flexion Therapeutic Exercise: Supine: Manual calf stretch, +/- toe extension, including talocrural and subtalar joints  PATIENT EDUCATION:  Education details: Updated HEP Person educated: Patient Education method: Explanation,  Demonstration, and Handouts  Education comprehension: verbalized understanding, returned demonstration, and needs further education  HOME EXERCISE PROGRAM: Access Code: [email or web address] URL: [email or web address] Date: 01/30/2023 Prepared by: Carlynn Herald  Exercises - Gastroc Stretch on Wall  - 1 x daily - 7 x weekly - 1 sets - 3 reps - 20-30 seconds hold - Soleus Stretch on Wall  - 1 x daily - 7 x weekly - 1 sets - 3 reps - 20-30 seconds hold - Supine Hip External Rotation Stretch  - 1 x daily - 7 x weekly - 1 sets - 3 reps - 20-30 seconds hold - Ankle Inversion with Resistance  - 1 x daily - 7 x weekly - 3 sets - 10 reps - Ankle Eversion with Resistance  - 1 x daily - 7 x weekly - 3 sets - 10 reps - Towel Scrunches  - 1 x daily - 7 x weekly - 3 sets - 10 reps - Ankle Dorsiflexion with Resistance  - 1 x daily - 7 x weekly - 3 sets - 10 reps - Ankle and Toe Plantarflexion with Resistance  - 1 x daily - 7 x weekly - 3 sets - 10 reps - Sidelying Hip Abduction  - 1 x daily - 7 x weekly - 3 sets - 10 reps - Standing Heel Raises  - 1 x daily - 7 x weekly - 3 sets - 10 reps  ASSESSMENT:  CLINICAL IMPRESSION: Progressed step ups and downs this visit within tolerable range. Pt continues to respond well to manual work and plan to continue this to decrease pain. Pt continues with post discomfort during gastroc and soleus stretching, encouraged pt to hold at furthest tolerable range at home to improve tolerance   OBJECTIVE IMPAIRMENTS: decreased activity tolerance, decreased balance, difficulty walking, decreased ROM, decreased strength, and pain.    GOALS: Goals reviewed with patient? Yes  SHORT TERM GOALS: Target date: 02/21/2023  Pt will be independent with initial HEP Baseline: Goal status: INITIAL  2.  Pt will improve FOTO to >= 55 to demo improving functional mobility Baseline:  Goal status: INITIAL  3.  Pt will improve Rt ankle DF ROM to 5 degrees to improve stair  negotiation Baseline:  Goal status: INITIAL   LONG TERM GOALS: Target date: 03/21/2023  Pt will be independent with advanced HEP Baseline:  Goal status: INITIAL  2.  Pt will improve FOTO to >= 63 to demo improved functional mobility Baseline:  Goal status: INITIAL  3.  Pt will improve LE strength to 4+/5 to improve standing and walking tolerance Baseline:  Goal status: INITIAL  4.  Pt will walk x 10 minutes with ankle and knee pain <= 2/10 Baseline:  Goal status: INITIAL  5.  Pt will negotiate stairs with reciprocal pattern with pain <= 2/10 Baseline:  Goal status: INITIAL   PLAN:  PT FREQUENCY: 2x/week  PT DURATION: 8 weeks  PLANNED INTERVENTIONS: 97164- PT Re-evaluation, 97110-Therapeutic exercises, 97530- Therapeutic activity, 97112- Neuromuscular re-education, 97535- Self Care, 60454- Manual therapy, [id number]- Gait training, [phone]- Aquatic Therapy, 97014- Electrical stimulation (unattended), 97016- Vasopneumatic device, [phone]- Ionotophoresis 4mg /ml Dexamethasone, Patient/Family education, Balance training, Stair training, Taping, Dry Needling, Moist heat, and Biofeedback  PLAN FOR NEXT SESSION: manual for ankle mobility, stairs, heel raises as tolerated   Breahna Boylen, PT 02/08/2023, 11:41 AM

## 2023-02-09 ENCOUNTER — Encounter: Payer: BC Managed Care – PPO | Admitting: Physical Therapy

## 2023-02-13 ENCOUNTER — Ambulatory Visit: Payer: BC Managed Care – PPO

## 2023-02-13 DIAGNOSIS — M170 Bilateral primary osteoarthritis of knee: Secondary | ICD-10-CM | POA: Diagnosis not present

## 2023-02-13 DIAGNOSIS — M25571 Pain in right ankle and joints of right foot: Secondary | ICD-10-CM

## 2023-02-13 DIAGNOSIS — G8929 Other chronic pain: Secondary | ICD-10-CM

## 2023-02-13 DIAGNOSIS — R262 Difficulty in walking, not elsewhere classified: Secondary | ICD-10-CM

## 2023-02-13 NOTE — Therapy (Signed)
OUTPATIENT PHYSICAL THERAPY LOWER EXTREMITY TREATMENT   Patient Name: Tracy [name] MRN: [id number] DOB:May 22, 1965, 58 y.o., female Today's Date: 02/13/2023  END OF SESSION:  PT End of Session - 02/13/23 0800     Visit Number 6    Number of Visits 16    Date for PT Re-Evaluation 03/21/23    Authorization Type BCBS    PT Start Time 0800    PT Stop Time 0852    PT Time Calculation (min) 52 min    Activity Tolerance Patient tolerated treatment well    Behavior During Therapy [facility] for tasks assessed/performed            Past Medical History:  Diagnosis Date   Hypertension    Past Surgical History:  Procedure Laterality Date   TOE SURGERY     WISDOM TOOTH EXTRACTION     Patient Active Problem List   Diagnosis Date Noted   Achilles tendinitis, right leg 01/05/2023   Seasonal allergies 05/02/2021   Primary osteoarthritis of left hip 07/07/2020   Primary osteoarthritis of both knees 10/05/2015   Lumbar degenerative disc disease 10/05/2015   Left cervical radiculopathy 01/22/2015    PCP: Venia Minks  REFERRING PROVIDER: Benjamin Stain  REFERRING DIAG: Rt knee OA, Rt achilles tendinosis  THERAPY DIAG:  Pain in right ankle and joints of right foot  Difficulty in walking, not elsewhere classified  Chronic pain of both knees  Rationale for Evaluation and Treatment: Rehabilitation  ONSET DATE: October 2024  SUBJECTIVE:   SUBJECTIVE STATEMENT: Patient reports no change since eval with pain symptoms, states she primarily has pain at medial ankle and and back of heel/ankle when she is weight bearing on R leg. Patient states she has pain at medial L knee; states pain in ankle/knee "comes and goes".  EVAL: Pt states that in October she was carrying something heavy and her knee "popped" and she developed a baker's cyst in her Rt knee. After the knee injury her Rt foot began hurting and she finally saw Dr. Karie Schwalbe at the end of December who diagnosed her with Rt achilles  tendonitis and prescribed a walking boot. She states she has a few days left to wear the boot. She states that her Lt knee began hurting about 2 weeks ago but that her Rt knee is feeling better. Rt ankle and Lt knee pain increase with stair negotiation, incline/decline, pain decrease with rest and meds.  PERTINENT HISTORY: History of knee pain, OA PAIN:  Are you having pain? Yes: NPRS scale: 4-5/10 in Rt foot, 0/10 Lt knee Pain location: Rt foot, Lt knee Pain description: sore, achey Aggravating factors: stairs, inclines Relieving factors: rest, meds  PRECAUTIONS: None  RED FLAGS: None   WEIGHT BEARING RESTRICTIONS: No  FALLS:  Has patient fallen in last 6 months? 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  DIAGNOSTIC FINDINGS:  Rt ankle x ray: 1. Moderate size fragmented Achilles tendon enthesophyte with mild associated soft tissue thickening. 2. Moderate to large fragmented plantar calcaneal spur. 3. Mild subtalar osteoarthritis.  PATIENT SURVEYS:  FOTO 48   EDEMA:  +1 edema Lt knee   PALPATION: No TTP Rt ankle or Lt knee   LOWER EXTREMITY ROM:  Active ROM Right eval Left eval  Hip flexion  Hip extension    Hip abduction    Hip adduction    Hip internal rotation    Hip external rotation decreased decreased  Knee flexion 115 104  Knee extension    Ankle dorsiflexion 0 6  Ankle plantarflexion 45 55  Ankle inversion 10 25  Ankle eversion 25 31   (Blank rows = not tested)  LOWER EXTREMITY MMT:  MMT Right eval Left eval  Hip flexion 4- 4-  Hip extension    Hip abduction 4 4  Hip adduction    Hip internal rotation    Hip external rotation    Knee flexion 4- 4-  Knee extension 4 4  Ankle dorsiflexion 4 4  Ankle  plantarflexion 4 4  Ankle inversion 3+ 4  Ankle eversion 3+ 4   (Blank rows = not tested)   GAIT: Distance walked: 100' Assistive device utilized: None Level of assistance: Complete Independence Comments: decreased cadence  [facility] Adult PT Treatment:                                                DATE: 02/13/2023 Therapeutic Exercise: (L) SLR in parallel & ER x10 each S/L hip abd straight leg in IR 2x10 Supine HS & ITB stretches w/strap x30" each Seated:  Resisted ankle EVER/INVER 2x10 each Toe raises Resisted heel raises + 15#KB resting on knee Alt towel scrunch + toe splay Heel raises + 4" inch ball b/w heels 2x10 Manual Therapy: IASTM (L) distal adductors & distal ITB/lateral quad  IASTM (R) post tib & medial gastroc (very tender) Modalities: Vaso (L) knee & (R) ankle, 34 deg, med compression x 10 min   [facility] Adult PT Treatment:                                                DATE: 02/08/23 Therapeutic Exercise: Nustep L6 x 5 min for warm up Reciprocal stair negotiation 4'' steps x 10 Lateral step down 2'' step x 10 bilat Standing heel raises x 10 Gastroc stretch x 30 sec Soleus stretch x 30 sec Seated toe raises for strength and ROM x 20 Ankle inv/eversion green TB x 20 Soleus heel raise 15# KB on knee x 20 Toe splay x 15 BAPS L2 CW/CCW x 10 Manual Therapy: STM and IASTM Rt calf and achilles tendon   [facility] Adult PT Treatment:                                                DATE: 02/06/23 Therapeutic Exercise: Nustep L6 x 5 min for warm up Gastroc stretch x 30 seconds Soleus stretch x 30 seconds Seated toe raises for strength and ROM x 20 Ankle inv/eversion green TB x 20 Soleus heel raise 15# KB on knee x 20 Towel scrunch x 1 min Toe splay x 15 BAPS L2 CW/CCW x 10 Heel raises x 10, toes in x 5, toes out x 5 Step ups x 10 6'' step with light UE support Manual Therapy: STM and IASTM Rt calf and achilles tendon Passive Rt calf and great toe stretch to improve  mobility   [facility] Adult PT Treatment:                                            DATE: 02/01/2023 Manual Therapy: Supine: Instrument assisted soft tissue mobilization to R calf and Achilles tendon Instrument assisted soft tissue mobilization to L medial calf, pes anserinus region, and adductor region L AP tibial glides near end-range L knee flexion L PA glides (towel roll in popliteal region) mobilization with movement into L knee flexion Therapeutic Exercise: Supine: Manual calf stretch, +/- toe extension, including talocrural and subtalar joints                                                                                                                               PATIENT EDUCATION:  Education details: Updated HEP Person educated: Patient Education method: Programmer, multimedia, Demonstration, and Handouts  Education comprehension: verbalized understanding, returned demonstration, and needs further education  HOME EXERCISE PROGRAM: Access Code: [email or web address] URL: [email or web address] Date: 02/13/2023 Prepared by: Carlynn Herald  Exercises - Gastroc Stretch on Wall  - 1 x daily - 7 x weekly - 1 sets - 3 reps - 20-30 seconds hold - Soleus Stretch on Wall  - 1 x daily - 7 x weekly - 1 sets - 3 reps - 20-30 seconds hold - Supine Hip External Rotation Stretch  - 1 x daily - 7 x weekly - 1 sets - 3 reps - 20-30 seconds hold - Ankle Inversion with Resistance  - 1 x daily - 7 x weekly - 3 sets - 10 reps - Ankle Eversion with Resistance  - 1 x daily - 7 x weekly - 3 sets - 10 reps - Towel Scrunches  - 1 x daily - 7 x weekly - 3 sets - 10 reps - Ankle Dorsiflexion with Resistance  - 1 x daily - 7 x weekly - 3 sets - 10 reps - Ankle and Toe Plantarflexion with Resistance  - 1 x daily - 7 x weekly - 3 sets - 10 reps - Sidelying Hip Abduction  - 1 x daily - 7 x weekly - 3 sets - 10 reps - Standing Heel Raises  - 1 x daily - 7 x weekly - 3 sets - 10 reps - Long Sitting Calf Stretch with  Strap  - 3 x daily - 7 x weekly - 1 sets - 3 reps - 30 sec hold  ASSESSMENT:  CLINICAL IMPRESSION: Hip and ankle strengthening continued; patient tolerated standing heel raises with no exacerbation of pain. Significant tenderness with palpitation along distal ITB/lateral quad on L LE and along posterior tib/medial gastroc on R LE. Provided patient with seated gastroc stretch in sitting to modify due to L knee pain in standing.  OBJECTIVE IMPAIRMENTS: decreased activity tolerance, decreased balance, difficulty walking, decreased ROM, decreased strength, and pain.  GOALS: Goals reviewed with patient? Yes  SHORT TERM GOALS: Target date: 02/21/2023  Pt will be independent with initial HEP Baseline: Goal status: INITIAL  2.  Pt will improve FOTO to >= 55 to demo improving functional mobility Baseline:  Goal status: INITIAL  3.  Pt will improve Rt ankle DF ROM to 5 degrees to improve stair negotiation Baseline:  Goal status: INITIAL   LONG TERM GOALS: Target date: 03/21/2023  Pt will be independent with advanced HEP Baseline:  Goal status: INITIAL  2.  Pt will improve FOTO to >= 63 to demo improved functional mobility Baseline:  Goal status: INITIAL  3.  Pt will improve LE strength to 4+/5 to improve standing and walking tolerance Baseline:  Goal status: INITIAL  4.  Pt will walk x 10 minutes with ankle and knee pain <= 2/10 Baseline:  Goal status: INITIAL  5.  Pt will negotiate stairs with reciprocal pattern with pain <= 2/10 Baseline:  Goal status: INITIAL   PLAN:  PT FREQUENCY: 2x/week  PT DURATION: 8 weeks  PLANNED INTERVENTIONS: 97164- PT Re-evaluation, 97110-Therapeutic exercises, 97530- Therapeutic activity, 97112- Neuromuscular re-education, 97535- Self Care, 16109- Manual therapy, [id number]- Gait training, [phone]- Aquatic Therapy, 97014- Electrical stimulation (unattended), 97016- Vasopneumatic device, 97033- Ionotophoresis 4mg /ml Dexamethasone, Patient/Family  education, Balance training, Stair training, Taping, Dry Needling, Moist heat, and Biofeedback  PLAN FOR NEXT SESSION: manual for ankle mobility, stairs, heel raises as tolerated   Sanjuana Mae, PTA 02/13/2023, 8:47 AM

## 2023-02-14 NOTE — Therapy (Signed)
OUTPATIENT PHYSICAL THERAPY LOWER EXTREMITY TREATMENT   Patient Name: Tracy [name] MRN: [id number] DOB:Nov 20, 1965, 58 y.o., female Today's Date: 02/15/2023  END OF SESSION:  PT End of Session - 02/15/23 1616     Visit Number 7    Number of Visits 16    Date for PT Re-Evaluation 03/21/23    Authorization Type BCBS    PT Start Time 1616    PT Stop Time 1700    PT Time Calculation (min) 44 min    Activity Tolerance Patient tolerated treatment well             Past Medical History:  Diagnosis Date   Hypertension    Past Surgical History:  Procedure Laterality Date   TOE SURGERY     WISDOM TOOTH EXTRACTION     Patient Active Problem List   Diagnosis Date Noted   Achilles tendinitis, right leg 01/05/2023   Seasonal allergies 05/02/2021   Primary osteoarthritis of left hip 07/07/2020   Primary osteoarthritis of both knees 10/05/2015   Lumbar degenerative disc disease 10/05/2015   Left cervical radiculopathy 01/22/2015    PCP: Venia Minks  REFERRING PROVIDER: Benjamin Stain  REFERRING DIAG: Rt knee OA, Rt achilles tendinosis  THERAPY DIAG:  Pain in right ankle and joints of right foot  Difficulty in walking, not elsewhere classified  Chronic pain of both knees  Rationale for Evaluation and Treatment: Rehabilitation  ONSET DATE: October 2024  SUBJECTIVE:   SUBJECTIVE STATEMENT: Pt states she is having a fair amount of pain still. Still has good days and bad days. Knee is having some mild pain today, 4/10 in R ankle.   EVAL: Pt states that in October she was carrying something heavy and her knee "popped" and she developed a baker's cyst in her Rt knee. After the knee injury her Rt foot began hurting and she finally saw Dr. Karie Schwalbe at the end of December who diagnosed her with Rt achilles tendonitis and prescribed a walking boot. She states she has a few days left to wear the boot. She states that her Lt knee began hurting about 2 weeks ago but that her Rt knee is  feeling better. Rt ankle and Lt knee pain increase with stair negotiation, incline/decline, pain decrease with rest and meds.  PERTINENT HISTORY: History of knee pain, OA PAIN:  Are you having pain? Yes: NPRS scale: 4-5/10 in Rt foot, 0/10 Lt knee Pain location: Rt foot, Lt knee Pain description: sore, achey Aggravating factors: stairs, inclines Relieving factors: rest, meds  PRECAUTIONS: None  RED FLAGS: None   WEIGHT BEARING RESTRICTIONS: No  FALLS:  Has patient fallen in last 6 months? 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  DIAGNOSTIC FINDINGS:  Rt ankle x ray: 1. Moderate size fragmented Achilles tendon enthesophyte with mild associated soft tissue thickening. 2. Moderate to large fragmented plantar calcaneal spur. 3. Mild subtalar osteoarthritis.  PATIENT SURVEYS:  FOTO 48   EDEMA:  +1 edema Lt knee   PALPATION: No TTP Rt ankle or Lt knee   LOWER EXTREMITY ROM:  Active ROM Right eval Left eval  Hip flexion    Hip extension    Hip abduction    Hip adduction    Hip internal rotation    Hip  external rotation decreased decreased  Knee flexion 115 104  Knee extension    Ankle dorsiflexion 0 6  Ankle plantarflexion 45 55  Ankle inversion 10 25  Ankle eversion 25 31   (Blank rows = not tested)  LOWER EXTREMITY MMT:  MMT Right eval Left eval  Hip flexion 4- 4-  Hip extension    Hip abduction 4 4  Hip adduction    Hip internal rotation    Hip external rotation    Knee flexion 4- 4-  Knee extension 4 4  Ankle dorsiflexion 4 4  Ankle plantarflexion 4 4  Ankle inversion 3+ 4  Ankle eversion 3+ 4   (Blank rows = not tested)   GAIT: Distance walked: 100' Assistive device utilized: None Level of assistance:  Complete Independence Comments: decreased cadence  [facility] Adult PT Treatment:                                                DATE: 02/15/23 Therapeutic Exercise: SLR x12 BIL neutral, x12 IR, x12 ER Seated DF AROM x12 Banded seated DF red band 2x8 20# KB soleus raise seated 2x8 Standing heel raises 2x8 at counter, UE support, cues for slow pacing, control Seated heel raise w/ ball at heels for post tib 2x12 2kg ball knee/ankle ROM rollouts cues for foot/knee positioning (paired knee flexion/ankleDF with knee ext/PF) HEP update + education   [facility] Adult PT Treatment:                                                DATE: 02/13/2023 Therapeutic Exercise: (L) SLR in parallel & ER x10 each S/L hip abd straight leg in IR 2x10 Supine HS & ITB stretches w/strap x30" each Seated:  Resisted ankle EVER/INVER 2x10 each Toe raises Resisted heel raises + 15#KB resting on knee Alt towel scrunch + toe splay Heel raises + 4" inch ball b/w heels 2x10 Manual Therapy: IASTM (L) distal adductors & distal ITB/lateral quad  IASTM (R) post tib & medial gastroc (very tender) Modalities: Vaso (L) knee & (R) ankle, 34 deg, med compression x 10 min   [facility] Adult PT Treatment:                                                DATE: 02/08/23 Therapeutic Exercise: Nustep L6 x 5 min for warm up Reciprocal stair negotiation 4'' steps x 10 Lateral step down 2'' step x 10 bilat Standing heel raises x 10 Gastroc stretch x 30 sec Soleus stretch x 30 sec Seated toe raises for strength and ROM x 20 Ankle inv/eversion green TB x 20 Soleus heel raise 15# KB on knee x 20 Toe splay x 15 BAPS L2 CW/CCW x 10 Manual Therapy: STM and IASTM Rt calf and achilles tendon   [facility] Adult PT Treatment:  DATE: 02/06/23 Therapeutic Exercise: Nustep L6 x 5 min for warm up Gastroc stretch x 30 seconds Soleus stretch x 30 seconds Seated toe raises for strength and ROM x 20 Ankle  inv/eversion green TB x 20 Soleus heel raise 15# KB on knee x 20 Towel scrunch x 1 min Toe splay x 15 BAPS L2 CW/CCW x 10 Heel raises x 10, toes in x 5, toes out x 5 Step ups x 10 6'' step with light UE support Manual Therapy: STM and IASTM Rt calf and achilles tendon Passive Rt calf and great toe stretch to improve mobility   [facility] Adult PT Treatment:                                            DATE: 02/01/2023 Manual Therapy: Supine: Instrument assisted soft tissue mobilization to R calf and Achilles tendon Instrument assisted soft tissue mobilization to L medial calf, pes anserinus region, and adductor region L AP tibial glides near end-range L knee flexion L PA glides (towel roll in popliteal region) mobilization with movement into L knee flexion Therapeutic Exercise: Supine: Manual calf stretch, +/- toe extension, including talocrural and subtalar joints                                                                                                                               PATIENT EDUCATION:  Education details: rationale for interventions, HEP  Person educated: Patient Education method: Explanation, Demonstration, Tactile cues, Verbal cues Education comprehension: verbalized understanding, returned demonstration, verbal cues required, tactile cues required, and needs further education      HOME EXERCISE PROGRAM: Access Code: [email or web address] URL: [email or web address] Date: 02/15/2023 Prepared by: Fransisco Hertz  Program Notes - with ankle pumps, can use ball and pair with bending/straightening knee as done in clinic  Exercises - Gastroc Stretch on Wall  - 1 x daily - 7 x weekly - 1 sets - 3 reps - 20-30 seconds hold - Soleus Stretch on Wall  - 1 x daily - 7 x weekly - 1 sets - 3 reps - 20-30 seconds hold - Supine Hip External Rotation Stretch  - 1 x daily - 7 x weekly - 1 sets - 3 reps - 20-30 seconds hold - Ankle Inversion with Resistance  - 1 x daily - 7 x  weekly - 3 sets - 10 reps - Ankle Eversion with Resistance  - 1 x daily - 7 x weekly - 3 sets - 10 reps - Towel Scrunches  - 1 x daily - 7 x weekly - 3 sets - 10 reps - Ankle Dorsiflexion with Resistance  - 1 x daily - 7 x weekly - 3 sets - 10 reps - Ankle and Toe Plantarflexion with Resistance  - 1 x daily - 7 x weekly - 3 sets - 10 reps - Sidelying  Hip Abduction  - 1 x daily - 7 x weekly - 3 sets - 10 reps - Standing Heel Raises  - 1 x daily - 7 x weekly - 3 sets - 10 reps - Long Sitting Calf Stretch with Strap  - 3 x daily - 7 x weekly - 1 sets - 3 reps - 30 sec hold - Seated Ankle Pumps  - 2-3 x daily - 7 x weekly - 1 sets - 10 reps  ASSESSMENT:  CLINICAL IMPRESSION: Pt arrives today with continued knee/ankle pain - deferring manual today as pt states at times it has been provocative, instead focusing on strengthening exercises in open and closed chain. Report some symptom irritability with standing heel raises but otherwise primary report is of muscular fatigue/stretching. No adverse events, reports modest improvement in symptoms on departure. Recommend continuing along current POC in order to address relevant deficits and improve functional tolerance. Pt departs today's session in no acute distress, all voiced questions/concerns addressed appropriately from PT perspective.     OBJECTIVE IMPAIRMENTS: decreased activity tolerance, decreased balance, difficulty walking, decreased ROM, decreased strength, and pain.    GOALS: Goals reviewed with patient? Yes  SHORT TERM GOALS: Target date: 02/21/2023  Pt will be independent with initial HEP Baseline: Goal status: INITIAL  2.  Pt will improve FOTO to >= 55 to demo improving functional mobility Baseline:  Goal status: INITIAL  3.  Pt will improve Rt ankle DF ROM to 5 degrees to improve stair negotiation Baseline:  Goal status: INITIAL   LONG TERM GOALS: Target date: 03/21/2023  Pt will be independent with advanced HEP Baseline:   Goal status: INITIAL  2.  Pt will improve FOTO to >= 63 to demo improved functional mobility Baseline:  Goal status: INITIAL  3.  Pt will improve LE strength to 4+/5 to improve standing and walking tolerance Baseline:  Goal status: INITIAL  4.  Pt will walk x 10 minutes with ankle and knee pain <= 2/10 Baseline:  Goal status: INITIAL  5.  Pt will negotiate stairs with reciprocal pattern with pain <= 2/10 Baseline:  Goal status: INITIAL   PLAN:  PT FREQUENCY: 2x/week  PT DURATION: 8 weeks  PLANNED INTERVENTIONS: 97164- PT Re-evaluation, 97110-Therapeutic exercises, 97530- Therapeutic activity, 97112- Neuromuscular re-education, 97535- Self Care, 11914- Manual therapy, [id number]- Gait training, [phone]- Aquatic Therapy, 97014- Electrical stimulation (unattended), 97016- Vasopneumatic device, 97033- Ionotophoresis 4mg /ml Dexamethasone, Patient/Family education, Balance training, Stair training, Taping, Dry Needling, Moist heat, and Biofeedback  PLAN FOR NEXT SESSION: continue with foot/ankle strengthening within pt tolerance, monitoring post session response and modifying accordingly.    Ashley Murrain PT, DPT 02/15/2023 5:04 PM

## 2023-02-15 ENCOUNTER — Ambulatory Visit: Payer: BC Managed Care – PPO | Admitting: Physical Therapy

## 2023-02-15 ENCOUNTER — Encounter: Payer: Self-pay | Admitting: Physical Therapy

## 2023-02-15 DIAGNOSIS — M25571 Pain in right ankle and joints of right foot: Secondary | ICD-10-CM

## 2023-02-15 DIAGNOSIS — R262 Difficulty in walking, not elsewhere classified: Secondary | ICD-10-CM

## 2023-02-15 DIAGNOSIS — M170 Bilateral primary osteoarthritis of knee: Secondary | ICD-10-CM | POA: Diagnosis not present

## 2023-02-15 DIAGNOSIS — G8929 Other chronic pain: Secondary | ICD-10-CM

## 2023-02-16 ENCOUNTER — Ambulatory Visit: Payer: BC Managed Care – PPO | Admitting: Sports Medicine

## 2023-02-16 DIAGNOSIS — M7661 Achilles tendinitis, right leg: Secondary | ICD-10-CM | POA: Diagnosis not present

## 2023-02-16 DIAGNOSIS — M170 Bilateral primary osteoarthritis of knee: Secondary | ICD-10-CM | POA: Diagnosis not present

## 2023-02-16 NOTE — Progress Notes (Signed)
    Procedures performed today:    None.  Independent interpretation of notes and tests performed by another provider:   None.  Brief History, Exam, Impression, and Recommendations:    Primary osteoarthritis of both knees Known bilateral knee osteoarthritis, doing a little better after PT, she does have another month of physical therapy, if persistent discomfort in spite of this and arthritis Tylenol we will do an injection.  Achilles tendinitis, right leg Insertional Achilles enthesophytes, PT potentially helping slightly, unable to tolerate nitroglycerin. She will continue heel lifts, eccentric rehab for another month, if she does not have sufficient resolution of the follow-up we will do a retrocalcaneal bursa injection followed by boot immobilization for a week, she was informed to bring the boot at the next visit.    ____________________________________________ Ihor Austin. Benjamin Stain, M.D., ABFM., CAQSM., AME. Primary Care and Sports Medicine [facility]  Adjunct Professor of Family Medicine  [employer]  Restaurant manager, fast food

## 2023-02-16 NOTE — Assessment & Plan Note (Signed)
Known bilateral knee osteoarthritis, doing a little better after PT, she does have another month of physical therapy, if persistent discomfort in spite of this and arthritis Tylenol we will do an injection.

## 2023-02-16 NOTE — Assessment & Plan Note (Signed)
Insertional Achilles enthesophytes, PT potentially helping slightly, unable to tolerate nitroglycerin. She will continue heel lifts, eccentric rehab for another month, if she does not have sufficient resolution of the follow-up we will do a retrocalcaneal bursa injection followed by boot immobilization for a week, she was informed to bring the boot at the next visit.

## 2023-02-19 ENCOUNTER — Ambulatory Visit: Payer: BC Managed Care – PPO | Attending: Sports Medicine | Admitting: Physical Therapy

## 2023-02-19 ENCOUNTER — Encounter: Payer: Self-pay | Admitting: Physical Therapy

## 2023-02-19 DIAGNOSIS — M25561 Pain in right knee: Secondary | ICD-10-CM | POA: Insufficient documentation

## 2023-02-19 DIAGNOSIS — M25571 Pain in right ankle and joints of right foot: Secondary | ICD-10-CM | POA: Insufficient documentation

## 2023-02-19 DIAGNOSIS — G8929 Other chronic pain: Secondary | ICD-10-CM | POA: Insufficient documentation

## 2023-02-19 DIAGNOSIS — R262 Difficulty in walking, not elsewhere classified: Secondary | ICD-10-CM | POA: Diagnosis present

## 2023-02-19 DIAGNOSIS — M25562 Pain in left knee: Secondary | ICD-10-CM | POA: Diagnosis present

## 2023-02-19 NOTE — Therapy (Signed)
OUTPATIENT PHYSICAL THERAPY LOWER EXTREMITY TREATMENT   Patient Name: Tracy [name] MRN: [id number] DOB:04-02-1965, 58 y.o., female Today's Date: 02/19/2023  END OF SESSION:  PT End of Session - 02/19/23 0920     Visit Number 8    Number of Visits 16    Date for PT Re-Evaluation 03/21/23    Authorization Type BCBS    PT Start Time 0843    PT Stop Time 0921    PT Time Calculation (min) 38 min    Activity Tolerance Patient tolerated treatment well    Behavior During Therapy [facility] for tasks assessed/performed              Past Medical History:  Diagnosis Date   Hypertension    Past Surgical History:  Procedure Laterality Date   TOE SURGERY     WISDOM TOOTH EXTRACTION     Patient Active Problem List   Diagnosis Date Noted   Achilles tendinitis, right leg 01/05/2023   Seasonal allergies 05/02/2021   Primary osteoarthritis of left hip 07/07/2020   Primary osteoarthritis of both knees 10/05/2015   Lumbar degenerative disc disease 10/05/2015   Left cervical radiculopathy 01/22/2015    PCP: Venia Minks  REFERRING PROVIDER: Benjamin Stain  REFERRING DIAG: Rt knee OA, Rt achilles tendinosis  THERAPY DIAG:  Pain in right ankle and joints of right foot  Difficulty in walking, not elsewhere classified  Chronic pain of both knees  Rationale for Evaluation and Treatment: Rehabilitation  ONSET DATE: October 2024  SUBJECTIVE:   SUBJECTIVE STATEMENT: Pt states the knee pain "comes and goes". She saw MD who recommended continuing PT and if she still has pain he will do an injection  EVAL: Pt states that in October she was carrying something heavy and her knee "popped" and she developed a baker's cyst in her Rt knee. After the knee injury her Rt foot began hurting and she finally saw Dr. Karie Schwalbe at the end of December who diagnosed her with Rt achilles tendonitis and prescribed a walking boot. She states she has a few days left to wear the boot. She states that her Lt knee began  hurting about 2 weeks ago but that her Rt knee is feeling better. Rt ankle and Lt knee pain increase with stair negotiation, incline/decline, pain decrease with rest and meds.  PERTINENT HISTORY: History of knee pain, OA PAIN:  Are you having pain? Yes: NPRS scale: 4-5/10 in Rt foot, 0/10 Lt knee Pain location: Rt foot, Lt knee Pain description: sore, achey Aggravating factors: stairs, inclines Relieving factors: rest, meds  PRECAUTIONS: None  RED FLAGS: None   WEIGHT BEARING RESTRICTIONS: No  FALLS:  Has patient fallen in last 6 months? 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  DIAGNOSTIC FINDINGS:  Rt ankle x ray: 1. Moderate size fragmented Achilles tendon enthesophyte with mild associated soft tissue thickening. 2. Moderate to large fragmented plantar calcaneal spur. 3. Mild subtalar osteoarthritis.  PATIENT SURVEYS:  FOTO 48   EDEMA:  +1 edema Lt knee   PALPATION: No TTP Rt ankle or Lt knee   LOWER EXTREMITY ROM:  Active ROM Right eval Left eval  Hip flexion    Hip extension    Hip abduction    Hip adduction  Hip internal rotation    Hip external rotation decreased decreased  Knee flexion 115 104  Knee extension    Ankle dorsiflexion 0 6  Ankle plantarflexion 45 55  Ankle inversion 10 25  Ankle eversion 25 31   (Blank rows = not tested)  LOWER EXTREMITY MMT:  MMT Right eval Left eval  Hip flexion 4- 4-  Hip extension    Hip abduction 4 4  Hip adduction    Hip internal rotation    Hip external rotation    Knee flexion 4- 4-  Knee extension 4 4  Ankle dorsiflexion 4 4  Ankle plantarflexion 4 4  Ankle inversion 3+ 4  Ankle eversion 3+ 4   (Blank rows = not tested)   GAIT: Distance walked:  100' Assistive device utilized: None Level of assistance: Complete Independence Comments: decreased cadence  [facility] Adult PT Treatment:                                                DATE: 02/19/23 Therapeutic Exercise: Standing heel raise focus on eccentric lowering with cues for pacing x 10 Standing heel raise ball between heels x 10 Standing toe raise x 10 Seated ankle eversion red TB 2 x 10 bilat 20# KB soleus raise seated 2x8 SLR x 12 neutral, x 12 IR, x 12 ER bilat Hip abd 2 x 10 bilat Incline board gastroc stretch x 1 min  - some increased pain Rocker board A/P for calf stretch x 1 min Rocker board A/P static hold for balance x 1 min - intermittent UE support Attempted eccentric lateral step down 4'' step - pt only able to tolerate x 5 bilat   [facility] Adult PT Treatment:                                                DATE: 02/15/23 Therapeutic Exercise: SLR x12 BIL neutral, x12 IR, x12 ER Seated DF AROM x12 Banded seated DF red band 2x8 20# KB soleus raise seated 2x8 Standing heel raises 2x8 at counter, UE support, cues for slow pacing, control Seated heel raise w/ ball at heels for post tib 2x12 2kg ball knee/ankle ROM rollouts cues for foot/knee positioning (paired knee flexion/ankleDF with knee ext/PF) HEP update + education   [facility] Adult PT Treatment:                                                DATE: 02/13/2023 Therapeutic Exercise: (L) SLR in parallel & ER x10 each S/L hip abd straight leg in IR 2x10 Supine HS & ITB stretches w/strap x30" each Seated:  Resisted ankle EVER/INVER 2x10 each Toe raises Resisted heel raises + 15#KB resting on knee Alt towel scrunch + toe splay Heel raises + 4" inch ball b/w heels 2x10 Manual Therapy: IASTM (L) distal adductors & distal ITB/lateral quad  IASTM (R) post tib & medial gastroc (very tender) Modalities: Vaso (L) knee & (R) ankle, 34 deg, med compression x 10 min   [facility] Adult PT Treatment:  DATE: 02/08/23 Therapeutic Exercise: Nustep L6 x 5 min for warm up Reciprocal stair negotiation 4'' steps x 10 Lateral step down 2'' step x 10 bilat Standing heel raises x 10 Gastroc stretch x 30 sec Soleus stretch x 30 sec Seated toe raises for strength and ROM x 20 Ankle inv/eversion green TB x 20 Soleus heel raise 15# KB on knee x 20 Toe splay x 15 BAPS L2 CW/CCW x 10 Manual Therapy: STM and IASTM Rt calf and achilles tendon                                                                                                                               PATIENT EDUCATION:  Education details: rationale for interventions, HEP  Person educated: Patient Education method: Explanation, Demonstration, Tactile cues, Verbal cues Education comprehension: verbalized understanding, returned demonstration, verbal cues required, tactile cues required, and needs further education      HOME EXERCISE PROGRAM: Access Code: [email or web address] URL: [email or web address] Date: 02/15/2023 Prepared by: Fransisco Hertz  Program Notes - with ankle pumps, can use ball and pair with bending/straightening knee as done in clinic  Exercises - Gastroc Stretch on Wall  - 1 x daily - 7 x weekly - 1 sets - 3 reps - 20-30 seconds hold - Soleus Stretch on Wall  - 1 x daily - 7 x weekly - 1 sets - 3 reps - 20-30 seconds hold - Supine Hip External Rotation Stretch  - 1 x daily - 7 x weekly - 1 sets - 3 reps - 20-30 seconds hold - Ankle Inversion with Resistance  - 1 x daily - 7 x weekly - 3 sets - 10 reps - Ankle Eversion with Resistance  - 1 x daily - 7 x weekly - 3 sets - 10 reps - Towel Scrunches  - 1 x daily - 7 x weekly - 3 sets - 10 reps - Ankle Dorsiflexion with Resistance  - 1 x daily - 7 x weekly - 3 sets - 10 reps - Ankle and Toe Plantarflexion with Resistance  - 1 x daily - 7 x weekly - 3 sets - 10 reps - Sidelying Hip Abduction  - 1 x daily - 7 x weekly - 3 sets - 10 reps - Standing Heel  Raises  - 1 x daily - 7 x weekly - 3 sets - 10 reps - Long Sitting Calf Stretch with Strap  - 3 x daily - 7 x weekly - 1 sets - 3 reps - 30 sec hold - Seated Ankle Pumps  - 2-3 x daily - 7 x weekly - 1 sets - 10 reps  ASSESSMENT:  CLINICAL IMPRESSION: Pt with significant weakness in bilat hips and knees and requires frequent rests during SLR during session. She will benefit from continued hip and knee strengthening   OBJECTIVE IMPAIRMENTS: decreased activity tolerance, decreased balance, difficulty walking, decreased ROM, decreased strength, and pain.    GOALS: Goals reviewed with  patient? Yes  SHORT TERM GOALS: Target date: 02/21/2023  Pt will be independent with initial HEP Baseline: Goal status: INITIAL  2.  Pt will improve FOTO to >= 55 to demo improving functional mobility Baseline:  Goal status: INITIAL  3.  Pt will improve Rt ankle DF ROM to 5 degrees to improve stair negotiation Baseline:  Goal status: INITIAL   LONG TERM GOALS: Target date: 03/21/2023  Pt will be independent with advanced HEP Baseline:  Goal status: INITIAL  2.  Pt will improve FOTO to >= 63 to demo improved functional mobility Baseline:  Goal status: INITIAL  3.  Pt will improve LE strength to 4+/5 to improve standing and walking tolerance Baseline:  Goal status: INITIAL  4.  Pt will walk x 10 minutes with ankle and knee pain <= 2/10 Baseline:  Goal status: INITIAL  5.  Pt will negotiate stairs with reciprocal pattern with pain <= 2/10 Baseline:  Goal status: INITIAL   PLAN:  PT FREQUENCY: 2x/week  PT DURATION: 8 weeks  PLANNED INTERVENTIONS: 97164- PT Re-evaluation, 97110-Therapeutic exercises, 97530- Therapeutic activity, [id number]- Neuromuscular re-education, 97535- Self Care, 40981- Manual therapy, [id number]- Gait training, [phone]- Aquatic Therapy, 97014- Electrical stimulation (unattended), 97016- Vasopneumatic device, 97033- Ionotophoresis 4mg /ml Dexamethasone, Patient/Family education,  Balance training, Stair training, Taping, Dry Needling, Moist heat, and Biofeedback  PLAN FOR NEXT SESSION: CHECK STGs continue with foot/ankle strengthening within pt tolerance, monitoring post session response and modifying accordingly.   Reggy Eye, PT,DPT02/03/259:23 AM

## 2023-02-21 ENCOUNTER — Encounter: Payer: Self-pay | Admitting: Physical Therapy

## 2023-02-21 ENCOUNTER — Ambulatory Visit: Payer: BC Managed Care – PPO | Admitting: Physical Therapy

## 2023-02-21 DIAGNOSIS — M25571 Pain in right ankle and joints of right foot: Secondary | ICD-10-CM

## 2023-02-21 DIAGNOSIS — R262 Difficulty in walking, not elsewhere classified: Secondary | ICD-10-CM

## 2023-02-21 DIAGNOSIS — G8929 Other chronic pain: Secondary | ICD-10-CM

## 2023-02-21 NOTE — Therapy (Signed)
 OUTPATIENT PHYSICAL THERAPY LOWER EXTREMITY TREATMENT   Patient Name: Tracy [name] MRN: [id number] DOB:04-Jun-1965, 58 y.o., female Today's Date: 02/21/2023  END OF SESSION:  PT End of Session - 02/21/23 1523     Visit Number 9    Number of Visits 16    Date for PT Re-Evaluation 03/21/23    Authorization Type BCBS    PT Start Time 1445    PT Stop Time 1524    PT Time Calculation (min) 39 min    Activity Tolerance Patient tolerated treatment well    Behavior During Therapy [facility] for tasks assessed/performed               Past Medical History:  Diagnosis Date   Hypertension    Past Surgical History:  Procedure Laterality Date   TOE SURGERY     WISDOM TOOTH EXTRACTION     Patient Active Problem List   Diagnosis Date Noted   Achilles tendinitis, right leg 01/05/2023   Seasonal allergies 05/02/2021   Primary osteoarthritis of left hip 07/07/2020   Primary osteoarthritis of both knees 10/05/2015   Lumbar degenerative disc disease 10/05/2015   Left cervical radiculopathy 01/22/2015    PCP: Loring Leeds  REFERRING PROVIDER: Curtis  REFERRING DIAG: Rt knee OA, Rt achilles tendinosis  THERAPY DIAG:  Pain in right ankle and joints of right foot  Difficulty in walking, not elsewhere classified  Chronic pain of both knees  Rationale for Evaluation and Treatment: Rehabilitation  ONSET DATE: October 2024  SUBJECTIVE:   SUBJECTIVE STATEMENT: Pt states her knee feels good today. Her ankle is still bothering her  EVAL: Pt states that in October she was carrying something heavy and her knee popped and she developed a baker's cyst in her Rt knee. After the knee injury her Rt foot began hurting and she finally saw Dr. ONEIDA at the end of December who diagnosed her with Rt achilles tendonitis and prescribed a walking boot. She states she has a few days left to wear the boot. She states that her Lt knee began hurting about 2 weeks ago but that her Rt knee is feeling  better. Rt ankle and Lt knee pain increase with stair negotiation, incline/decline, pain decrease with rest and meds.  PERTINENT HISTORY: History of knee pain, OA PAIN:  Are you having pain? Yes: NPRS scale: 6/10 in Rt foot, 0/10 Lt knee Pain location: Rt foot, Lt knee Pain description: sore, achey Aggravating factors: stairs, inclines Relieving factors: rest, meds  PRECAUTIONS: None  RED FLAGS: None   WEIGHT BEARING RESTRICTIONS: No  FALLS:  Has patient fallen in last 6 months? 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  DIAGNOSTIC FINDINGS:  Rt ankle x ray: 1. Moderate size fragmented Achilles tendon enthesophyte with mild associated soft tissue thickening. 2. Moderate to large fragmented plantar calcaneal spur. 3. Mild subtalar osteoarthritis.  PATIENT SURVEYS:  FOTO 48   EDEMA:  +1 edema Lt knee   PALPATION: No TTP Rt ankle or Lt knee   LOWER EXTREMITY ROM:  Active ROM Right eval Left eval Right 2/5  Hip flexion     Hip extension     Hip abduction     Hip adduction     Hip internal rotation  Hip external rotation decreased decreased   Knee flexion 115 104   Knee extension     Ankle dorsiflexion 0 6 3  Ankle plantarflexion 45 55 48  Ankle inversion 10 25 20   Ankle eversion 25 31 20    (Blank rows = not tested)  LOWER EXTREMITY MMT:  MMT Right eval Left eval Right 02/21/23  Hip flexion 4- 4-   Hip extension     Hip abduction 4 4   Hip adduction     Hip internal rotation     Hip external rotation     Knee flexion 4- 4-   Knee extension 4 4   Ankle dorsiflexion 4 4   Ankle plantarflexion 4 4   Ankle inversion 3+ 4 4  Ankle eversion 3+ 4 4   (Blank rows = not tested)   GAIT: Distance walked: 100' Assistive  device utilized: None Level of assistance: Complete Independence Comments: decreased cadence  [facility] Adult PT Treatment:                                                DATE: 02/21/23 Therapeutic Exercise/Activity/NMR: Rocker board A/P for calf stretch x 1 min Rocker board A/P static hold for balance x 1 min - intermittent UE support Standing heel raise focus on eccentric control x 12 Standing toe raise x 15 Standing calf stretch 2 x 30 sec ROM measurements (see above) SLR x 12 neutral, x 12 IR, x 12 ER bilat Sidelying hip abd 2 x 10 bilat Education through handout and demo of self ankle distraction Manual Therapy: TC joint distraction Rt - good response   [facility] Adult PT Treatment:                                                DATE: 02/19/23 Therapeutic Exercise: Standing heel raise focus on eccentric lowering with cues for pacing x 10 Standing heel raise ball between heels x 10 Standing toe raise x 10 Seated ankle eversion red TB 2 x 10 bilat 20# KB soleus raise seated 2x8 SLR x 12 neutral, x 12 IR, x 12 ER bilat Hip abd 2 x 10 bilat Incline board gastroc stretch x 1 min  - some increased pain Rocker board A/P for calf stretch x 1 min Rocker board A/P static hold for balance x 1 min - intermittent UE support Attempted eccentric lateral step down 4'' step - pt only able to tolerate x 5 bilat   [facility] Adult PT Treatment:                                                DATE: 02/15/23 Therapeutic Exercise: SLR x12 BIL neutral, x12 IR, x12 ER Seated DF AROM x12 Banded seated DF red band 2x8 20# KB soleus raise seated 2x8 Standing heel raises 2x8 at counter, UE support, cues for slow pacing, control Seated heel raise w/ ball at heels for post tib 2x12 2kg ball knee/ankle ROM rollouts cues for foot/knee positioning (paired knee flexion/ankleDF with knee ext/PF) HEP update + education   [facility] Adult PT Treatment:  DATE: 02/13/2023 Therapeutic  Exercise: (L) SLR in parallel & ER x10 each S/L hip abd straight leg in IR 2x10 Supine HS & ITB stretches w/strap x30 each Seated:  Resisted ankle EVER/INVER 2x10 each Toe raises Resisted heel raises + 15#KB resting on knee Alt towel scrunch + toe splay Heel raises + 4 inch ball b/w heels 2x10 Manual Therapy: IASTM (L) distal adductors & distal ITB/lateral quad  IASTM (R) post tib & medial gastroc (very tender) Modalities: Vaso (L) knee & (R) ankle, 34 deg, med compression x 10 min                                                                                                                                PATIENT EDUCATION:  Education details: rationale for interventions, HEP  Person educated: Patient Education method: Explanation, Demonstration, Tactile cues, Verbal cues Education comprehension: verbalized understanding, returned demonstration, verbal cues required, tactile cues required, and needs further education      HOME EXERCISE PROGRAM: Access Code: [email or web address] URL: [email or web address] Date: 02/15/2023 Prepared by: Alm Jenny  Program Notes - with ankle pumps, can use ball and pair with bending/straightening knee as done in clinic  Exercises - Gastroc Stretch on Wall  - 1 x daily - 7 x weekly - 1 sets - 3 reps - 20-30 seconds hold - Soleus Stretch on Wall  - 1 x daily - 7 x weekly - 1 sets - 3 reps - 20-30 seconds hold - Supine Hip External Rotation Stretch  - 1 x daily - 7 x weekly - 1 sets - 3 reps - 20-30 seconds hold - Ankle Inversion with Resistance  - 1 x daily - 7 x weekly - 3 sets - 10 reps - Ankle Eversion with Resistance  - 1 x daily - 7 x weekly - 3 sets - 10 reps - Towel Scrunches  - 1 x daily - 7 x weekly - 3 sets - 10 reps - Ankle Dorsiflexion with Resistance  - 1 x daily - 7 x weekly - 3 sets - 10 reps - Ankle and Toe Plantarflexion with Resistance  - 1 x daily - 7 x weekly - 3 sets - 10 reps - Sidelying Hip Abduction  - 1 x daily -  7 x weekly - 3 sets - 10 reps - Standing Heel Raises  - 1 x daily - 7 x weekly - 3 sets - 10 reps - Long Sitting Calf Stretch with Strap  - 3 x daily - 7 x weekly - 1 sets - 3 reps - 30 sec hold - Seated Ankle Pumps  - 2-3 x daily - 7 x weekly - 1 sets - 10 reps  ASSESSMENT:  CLINICAL IMPRESSION: Pt has improved Rt ankle ROM and strength. She continues with pain in calf and achilles area that limits standing and walking tolerance . She had good tolerance to ankle joint distraction this session - added to HEP  OBJECTIVE IMPAIRMENTS: decreased activity tolerance, decreased balance, difficulty walking, decreased ROM, decreased strength, and pain.    GOALS: Goals reviewed with patient? Yes  SHORT TERM GOALS: Target date: 02/21/2023  Pt will be independent with initial HEP Baseline: Goal status: MET  2.  Pt will improve FOTO to >= 55 to demo improving functional mobility Baseline:  Goal status: IN PROGRESS 49 on 02/21/23  3.  Pt will improve Rt ankle DF ROM to 5 degrees to improve stair negotiation Baseline:  Goal status: IN PROGRESS   LONG TERM GOALS: Target date: 03/21/2023  Pt will be independent with advanced HEP Baseline:  Goal status: INITIAL  2.  Pt will improve FOTO to >= 63 to demo improved functional mobility Baseline:  Goal status: INITIAL  3.  Pt will improve LE strength to 4+/5 to improve standing and walking tolerance Baseline:  Goal status: INITIAL  4.  Pt will walk x 10 minutes with ankle and knee pain <= 2/10 Baseline:  Goal status: INITIAL  5.  Pt will negotiate stairs with reciprocal pattern with pain <= 2/10 Baseline:  Goal status: INITIAL   PLAN:  PT FREQUENCY: 2x/week  PT DURATION: 8 weeks  PLANNED INTERVENTIONS: 97164- PT Re-evaluation, 97110-Therapeutic exercises, 97530- Therapeutic activity, 97112- Neuromuscular re-education, 97535- Self Care, 02859- Manual therapy, [id number]- Gait training, [phone]- Aquatic Therapy, 97014- Electrical stimulation  (unattended), 97016- Vasopneumatic device, 97033- Ionotophoresis 4mg /ml Dexamethasone, Patient/Family education, Balance training, Stair training, Taping, Dry Needling, Moist heat, and Biofeedback  PLAN FOR NEXT SESSION: continue with foot/ankle strengthening within pt tolerance, monitoring post session response and modifying accordingly.   Darice Conine, PT,DPT02/05/253:23 PM

## 2023-02-23 IMAGING — DX DG HIP (WITH OR WITHOUT PELVIS) 2-3V*L*
3 series · 3 of 3 positions shown · non-contrast
Comparison: None.

CLINICAL DATA: Left hip pain

EXAM:
DG HIP (WITH OR WITHOUT PELVIS) 2-3V LEFT

[pelvis ap]
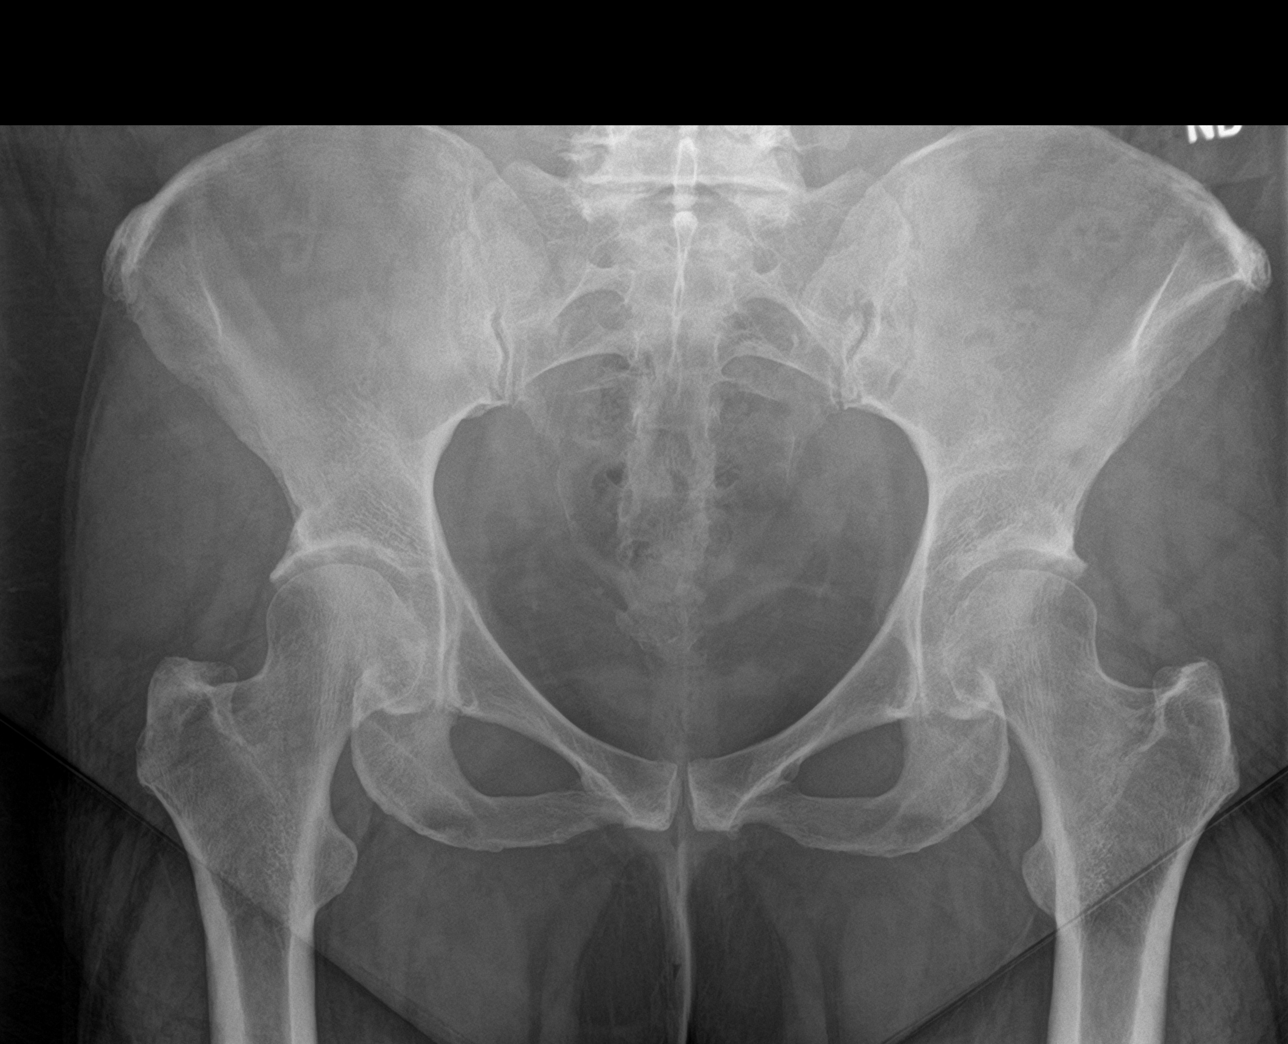

[hip ap]
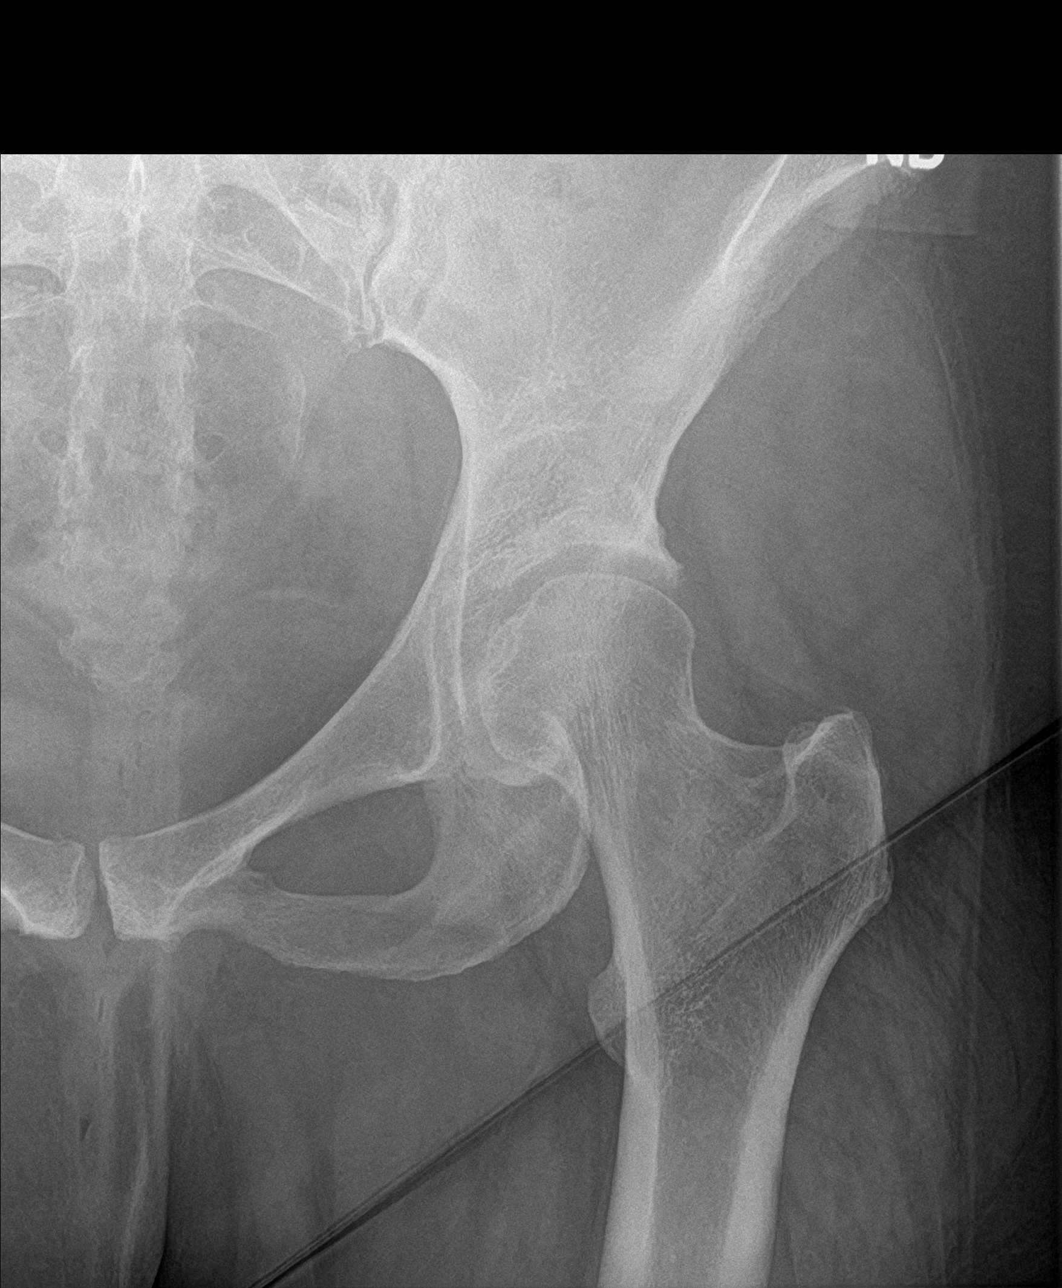

[hip lat]
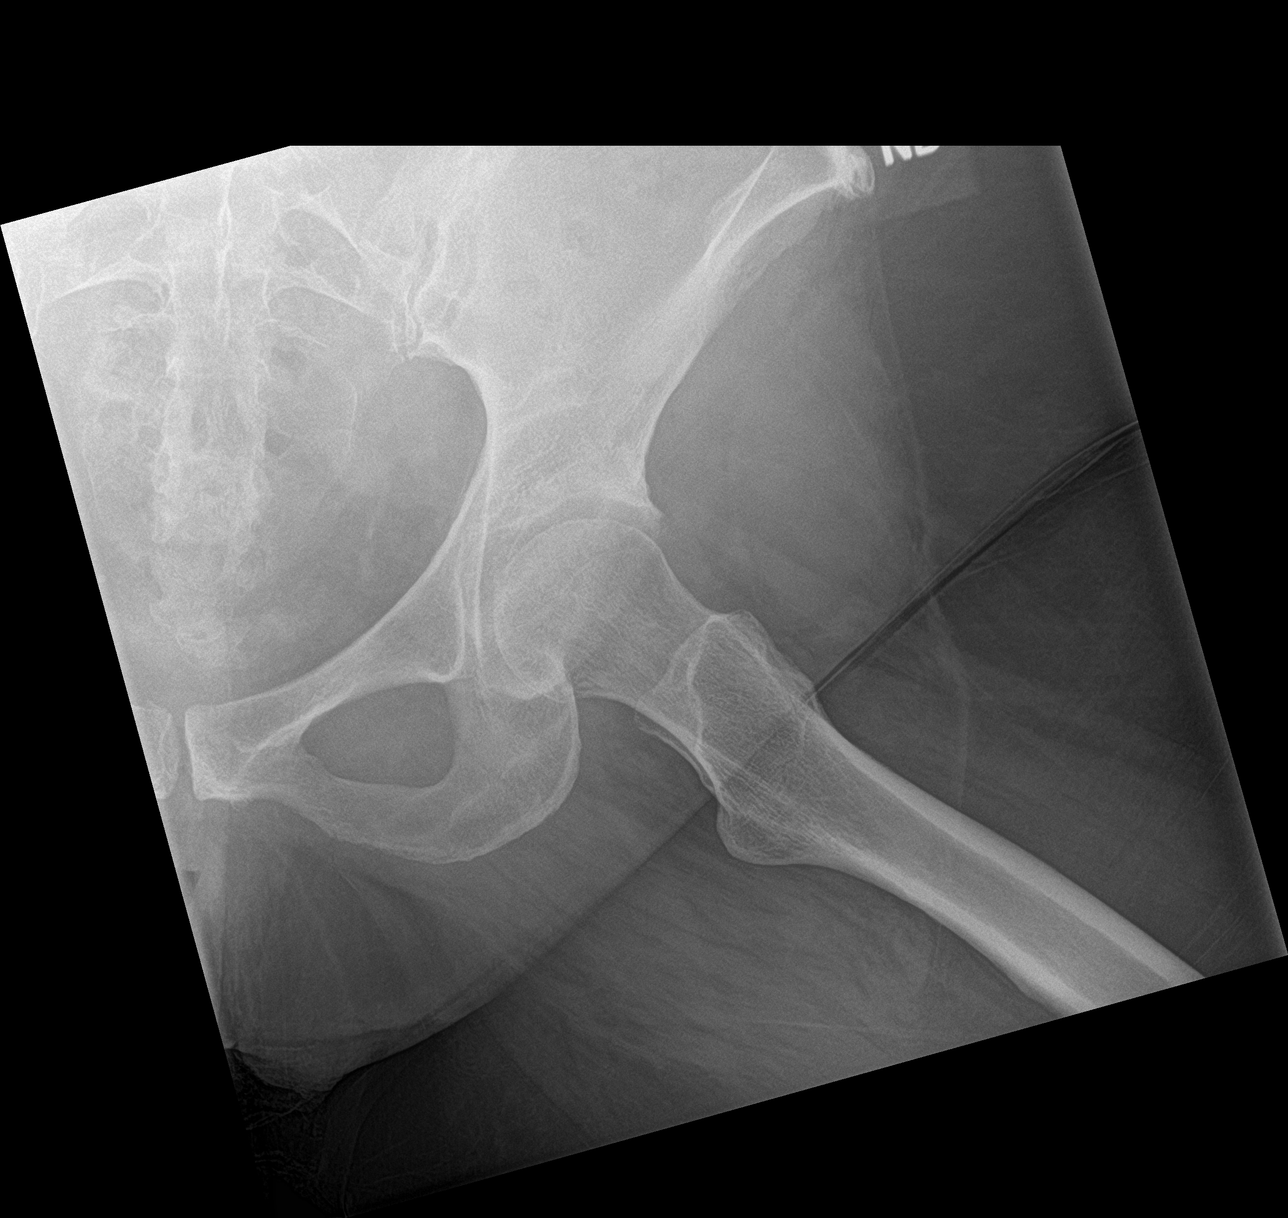

[3 of 3 positions shown; findings below may reference images not displayed]

FINDINGS: Lower lumbosacral degenerative changes noted. Normal SI joints for
age. Bony pelvis and hips appear symmetric intact. No acute osseous
finding fracture. Negative for hip fracture. No significant hip
arthropathy.
IMPRESSION: Lumbosacral degenerative changes.

No acute osseous finding.

## 2023-02-23 IMAGING — DX DG LUMBAR SPINE COMPLETE 4+V
6 series · 6 of 6 positions shown · non-contrast
Comparison: 02/02/2013

CLINICAL DATA: Low back pain and lower extremity paresthesias

EXAM:
LUMBAR SPINE - COMPLETE 4+ VIEW

[l-spine ap]
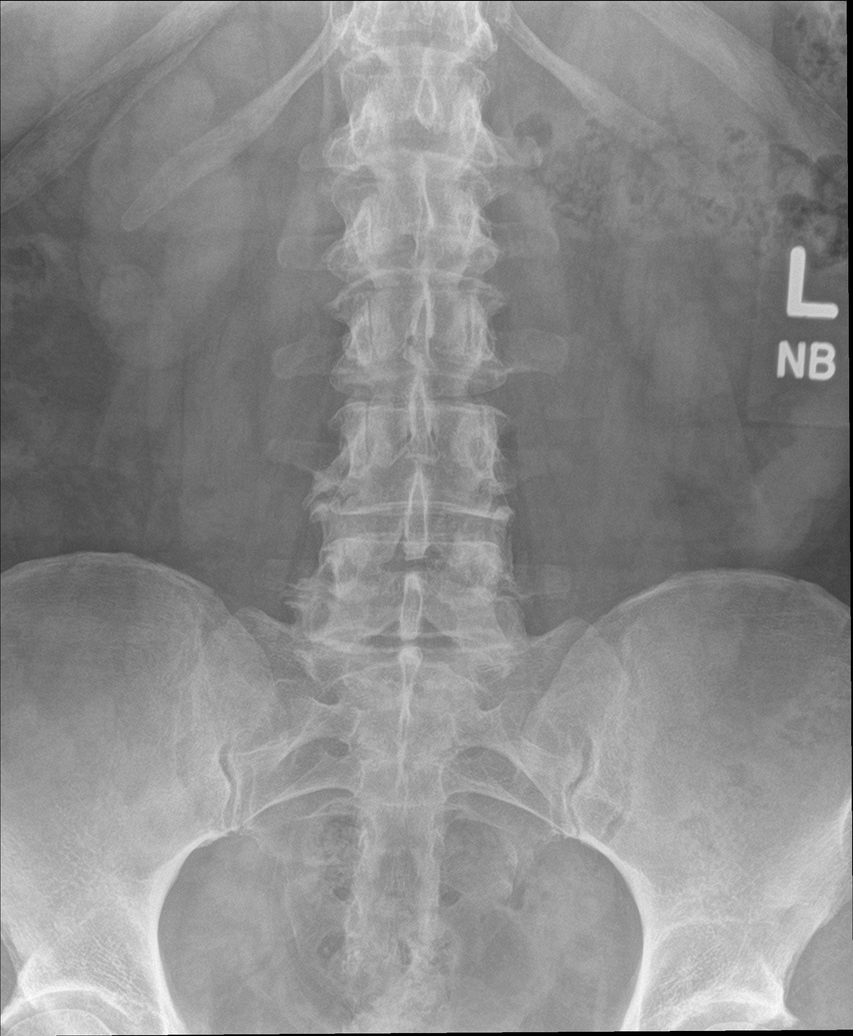

[l-spine obl (1 of 3)]
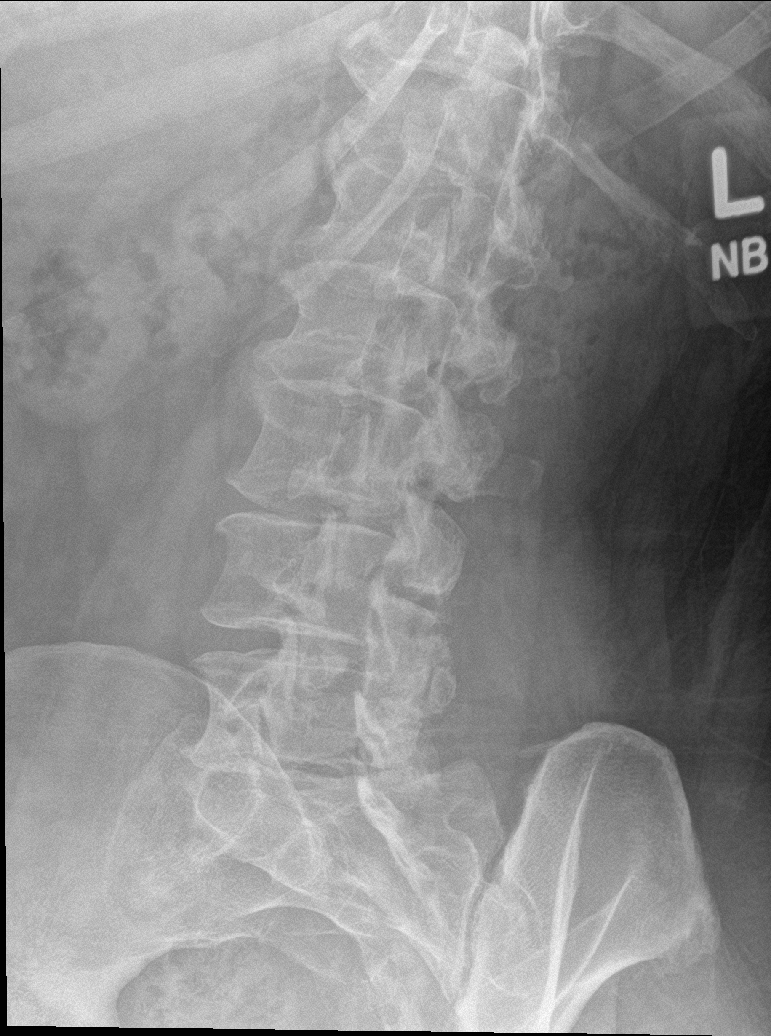

[l-spine obl (2 of 3)]
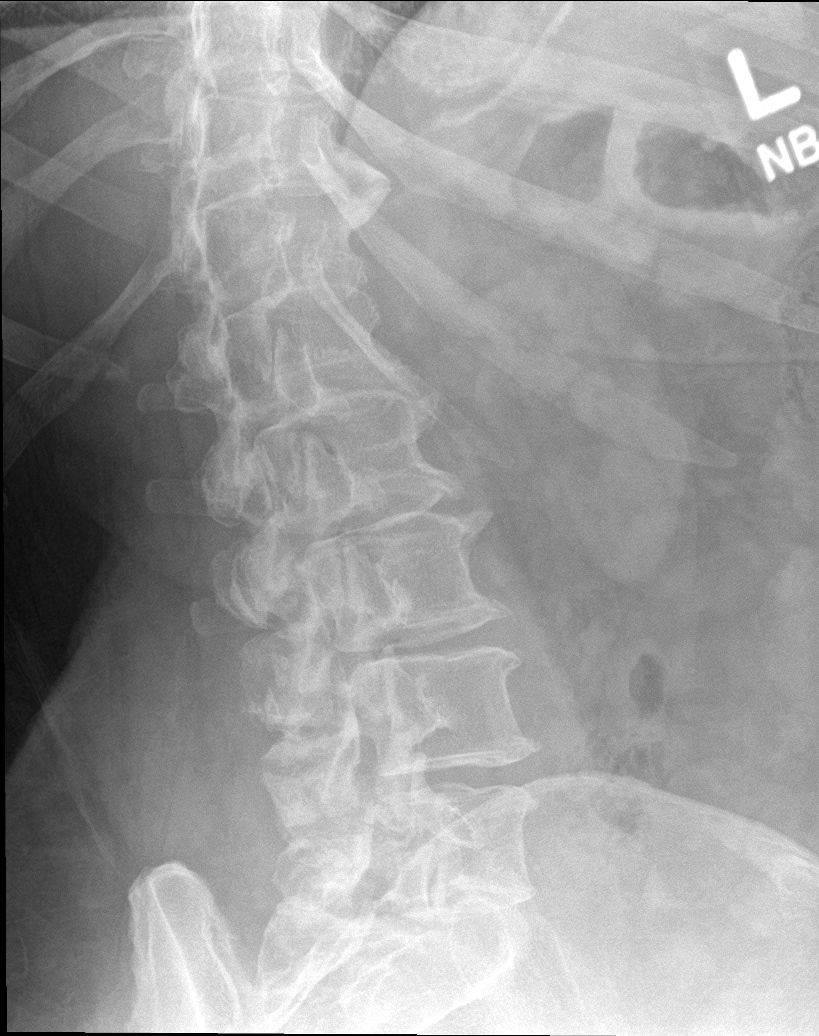

[l-spine lat]
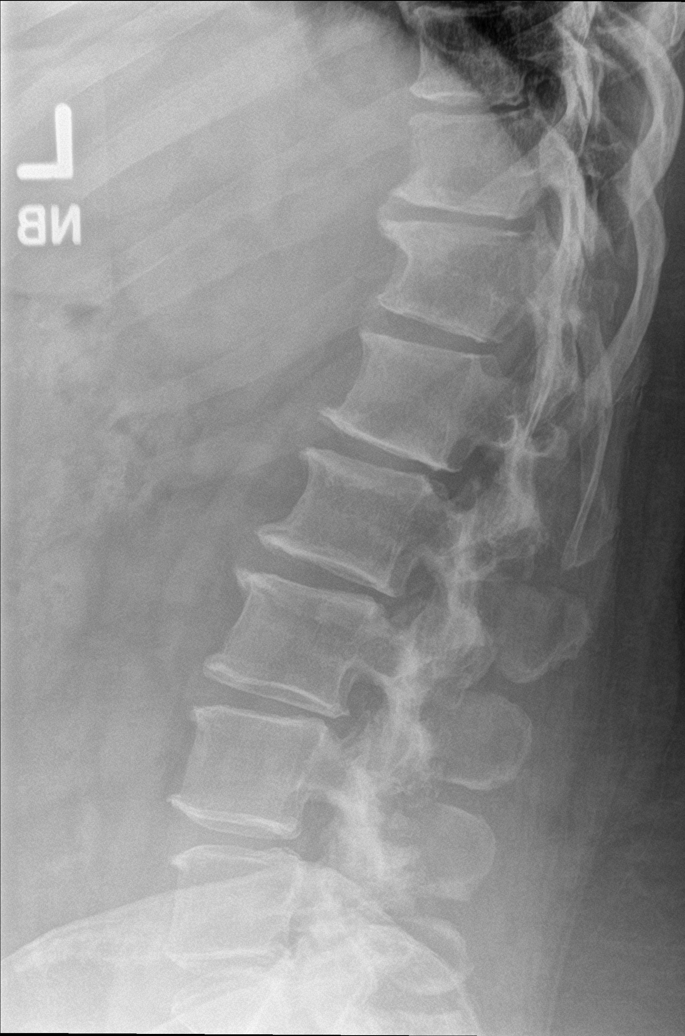

[l-spine spot]
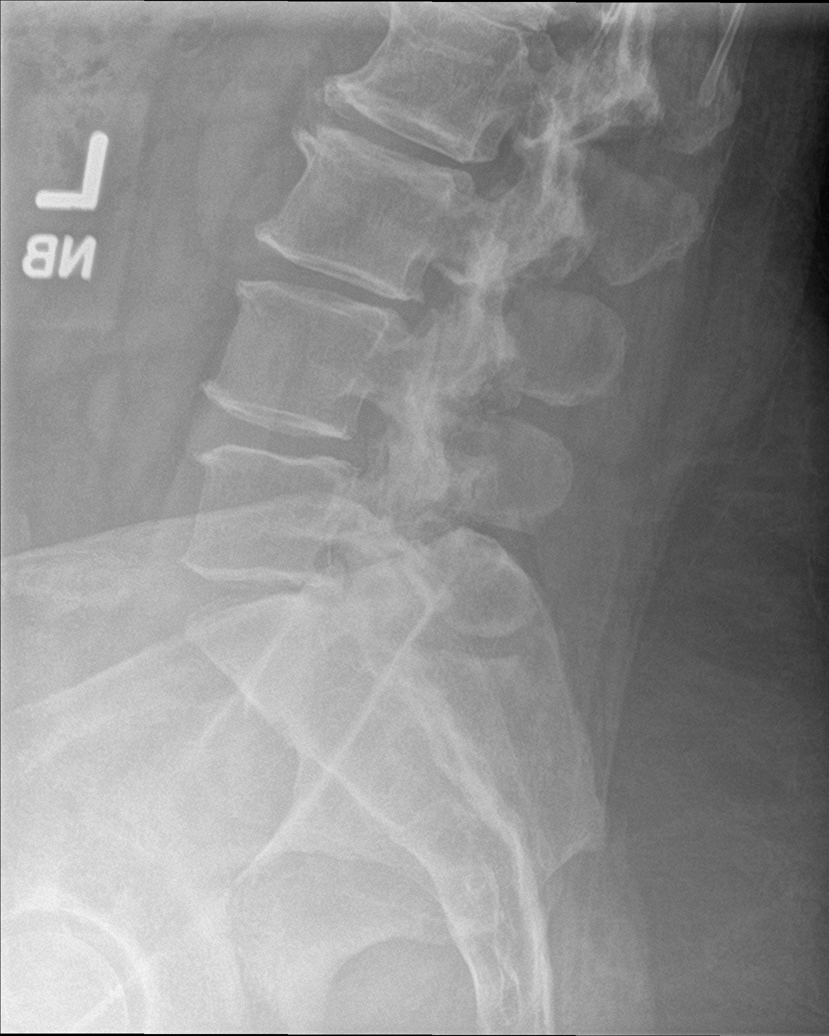

[l-spine obl (3 of 3)]
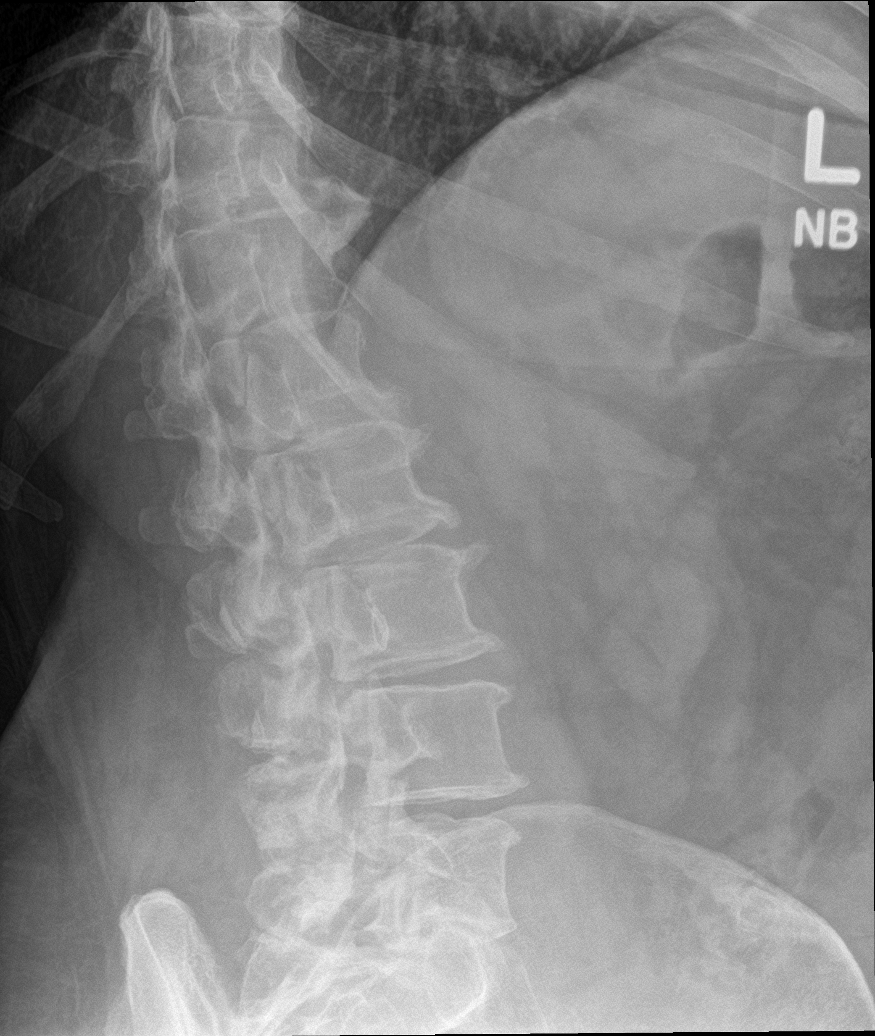

[6 of 6 positions shown; findings below may reference images not displayed]

FINDINGS: Progressive lower thoracic and lumbar endplate degenerative changes
at all levels with endplate sclerosis and bony spurring anteriorly.
Disc space narrowing most severe at L5-S1. Posterior facet
arthropathy spanning L2-S1. No pars defects visualized. No acute
compression fracture, wedge-shaped deformity or focal kyphosis.
Normal SI joints for age.

Nonobstructive bowel gas pattern.
IMPRESSION: Progressive lower thoracic and lumbar multilevel degenerative change
as above.

No acute osseous finding by plain radiography

## 2023-02-26 ENCOUNTER — Ambulatory Visit: Payer: BC Managed Care – PPO

## 2023-02-26 DIAGNOSIS — R262 Difficulty in walking, not elsewhere classified: Secondary | ICD-10-CM

## 2023-02-26 DIAGNOSIS — G8929 Other chronic pain: Secondary | ICD-10-CM

## 2023-02-26 DIAGNOSIS — M25571 Pain in right ankle and joints of right foot: Secondary | ICD-10-CM | POA: Diagnosis not present

## 2023-02-26 NOTE — Therapy (Signed)
 OUTPATIENT PHYSICAL THERAPY LOWER EXTREMITY TREATMENT   Patient Name: Tracy [name] MRN: [id number] DOB:March 21, 1965, 58 y.o., female Today's Date: 02/26/2023  END OF SESSION:  PT End of Session - 02/26/23 1530     Visit Number 10    Number of Visits 16    Date for PT Re-Evaluation 03/21/23    Authorization Type [employer]    PT Start Time 1530    PT Stop Time 1618    PT Time Calculation (min) 48 min    Activity Tolerance Patient tolerated treatment well    Behavior During Therapy [facility] for tasks assessed/performed            Past Medical History:  Diagnosis Date   Hypertension    Past Surgical History:  Procedure Laterality Date   TOE SURGERY     WISDOM TOOTH EXTRACTION     Patient Active Problem List   Diagnosis Date Noted   Achilles tendinitis, right leg 01/05/2023   Seasonal allergies 05/02/2021   Primary osteoarthritis of left hip 07/07/2020   Primary osteoarthritis of both knees 10/05/2015   Lumbar degenerative disc disease 10/05/2015   Left cervical radiculopathy 01/22/2015    PCP: Zigmund Hills  REFERRING PROVIDER: Sandy Crumb  REFERRING DIAG: Rt knee OA, Rt achilles tendinosis  THERAPY DIAG:  Pain in right ankle and joints of right foot  Chronic pain of both knees  Difficulty in walking, not elsewhere classified  Rationale for Evaluation and Treatment: Rehabilitation  ONSET DATE: October 2024  SUBJECTIVE:   SUBJECTIVE STATEMENT: Patient reports her knee pain flared up over the weekend but it has calmed down today, states it still "grabs" on the inside of L knee. Patient states her ankle is feeling "not as angry as it has been".  EVAL: Pt states that in October she was carrying something heavy and her knee "popped" and she developed a baker's cyst in her Rt knee. After the knee injury her Rt foot began hurting and she finally saw Dr. Elva Hamburger at the end of December who diagnosed her with Rt achilles tendonitis and prescribed a walking boot. She states she has  a few days left to wear the boot. She states that her Lt knee began hurting about 2 weeks ago but that her Rt knee is feeling better. Rt ankle and Lt knee pain increase with stair negotiation, incline/decline, pain decrease with rest and meds.  PERTINENT HISTORY: History of knee pain, OA PAIN:  Are you having pain? Yes: NPRS scale: 6/10 in Rt foot, 0/10 Lt knee Pain location: Rt foot, Lt knee Pain description: sore, achey Aggravating factors: stairs, inclines Relieving factors: rest, meds  PRECAUTIONS: None  RED FLAGS: None   WEIGHT BEARING RESTRICTIONS: No  FALLS:  Has patient fallen in last 6 months? 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  DIAGNOSTIC FINDINGS:  Rt ankle x ray: 1. Moderate size fragmented Achilles tendon enthesophyte with mild associated soft tissue thickening. 2. Moderate to large fragmented plantar calcaneal spur. 3. Mild subtalar osteoarthritis.  PATIENT SURVEYS:  FOTO 48   EDEMA:  +1 edema Lt knee   PALPATION: No TTP Rt ankle or Lt knee   LOWER EXTREMITY ROM:  Active ROM Right eval Left eval Right 2/5  Hip flexion     Hip  extension     Hip abduction     Hip adduction     Hip internal rotation     Hip external rotation decreased decreased   Knee flexion 115 104   Knee extension     Ankle dorsiflexion 0 6 3  Ankle plantarflexion 45 55 48  Ankle inversion 10 25 20   Ankle eversion 25 31 20    (Blank rows = not tested)  LOWER EXTREMITY MMT:  MMT Right eval Left eval Right 02/21/23  Hip flexion 4- 4-   Hip extension     Hip abduction 4 4   Hip adduction     Hip internal rotation     Hip external rotation     Knee flexion 4- 4-   Knee extension 4 4   Ankle dorsiflexion 4 4   Ankle plantarflexion 4  4   Ankle inversion 3+ 4 4  Ankle eversion 3+ 4 4   (Blank rows = not tested)   GAIT: Distance walked: 100' Assistive device utilized: None Level of assistance: Complete Independence Comments: decreased cadence  [facility] Adult PT Treatment:                                                DATE: 02/26/2023 Therapeutic Exercise: NuStep L5 x 5 min Ankle PROM --> passive DF stretches Seated ankle EVE/INV + GTB x15 each  Ankle DF/PF on rocker board for stretch Standing eccentric heel raises x10 Toe raises x10  (R) gastroc stretch on rocker board (opp foot on 4" step) Side stepping + YTB crossed at ankles (counter) HS curls (discontinued d/t hip discomfort/calf tightness) Bent over hip ext on diagonal Standing soleus stretch Seated resisted knee extension + YTB Manual Therapy: TC distraction  TC joint mobs     [facility] Adult PT Treatment:                                                DATE: 02/21/23 Therapeutic Exercise/Activity/NMR: Rocker board A/P for calf stretch x 1 min Rocker board A/P static hold for balance x 1 min - intermittent UE support Standing heel raise focus on eccentric control x 12 Standing toe raise x 15 Standing calf stretch 2 x 30 sec ROM measurements (see above) SLR x 12 neutral, x 12 IR, x 12 ER bilat Sidelying hip abd 2 x 10 bilat Education through handout and demo of self ankle distraction Manual Therapy: TC joint distraction Rt - good response   [facility] Adult PT Treatment:                                                DATE: 02/19/23 Therapeutic Exercise: Standing heel raise focus on eccentric lowering with cues for pacing x 10 Standing heel raise ball between heels x 10 Standing toe raise x 10 Seated ankle eversion red TB 2 x 10 bilat 20# KB soleus raise seated 2x8 SLR x 12 neutral, x 12 IR, x 12 ER bilat Hip abd 2 x 10 bilat Incline board gastroc stretch x 1 min  - some increased pain Rocker board A/P for calf  stretch x 1 min Rocker board A/P static hold  for balance x 1 min - intermittent UE support Attempted eccentric lateral step down 4'' step - pt only able to tolerate x 5 bilat                                                                                                                               PATIENT EDUCATION:  Education details: rationale for interventions, HEP  Person educated: Patient Education method: Explanation, Demonstration, Tactile cues, Verbal cues Education comprehension: verbalized understanding, returned demonstration, verbal cues required, tactile cues required, and needs further education      HOME EXERCISE PROGRAM: Access Code: [email or web address] URL: [email or web address] Date: 02/26/2023 Prepared by: Sims Duck  Program Notes - with ankle pumps, can use ball and pair with bending/straightening knee as done in clinic  Exercises - Gastroc Stretch on Wall  - 1 x daily - 7 x weekly - 1 sets - 3 reps - 20-30 seconds hold - Soleus Stretch on Wall  - 1 x daily - 7 x weekly - 1 sets - 3 reps - 20-30 seconds hold - Supine Hip External Rotation Stretch  - 1 x daily - 7 x weekly - 1 sets - 3 reps - 20-30 seconds hold - Ankle Inversion with Resistance  - 1 x daily - 7 x weekly - 3 sets - 10 reps - Ankle Eversion with Resistance  - 1 x daily - 7 x weekly - 3 sets - 10 reps - Towel Scrunches  - 1 x daily - 7 x weekly - 3 sets - 10 reps - Ankle Dorsiflexion with Resistance  - 1 x daily - 7 x weekly - 3 sets - 10 reps - Ankle and Toe Plantarflexion with Resistance  - 1 x daily - 7 x weekly - 3 sets - 10 reps - Sidelying Hip Abduction  - 1 x daily - 7 x weekly - 3 sets - 10 reps - Standing Heel Raises  - 1 x daily - 7 x weekly - 3 sets - 10 reps - Long Sitting Calf Stretch with Strap  - 3 x daily - 7 x weekly - 1 sets - 3 reps - 30 sec hold - Seated Ankle Pumps  - 2-3 x daily - 7 x weekly - 1 sets - 10 reps - Side Stepping with Resistance at Ankles  - 1 x daily - 7 x weekly - 3 sets - 10 reps - Seated Knee  Extension with Resistance  - 1 x daily - 7 x weekly - 3 sets - 10 reps  ASSESSMENT:  CLINICAL IMPRESSION: Eccentric ankle strengthening continued; mild discomfort noted in ankle with exercise. Resisted side stepping added to progress hip strengthening; greater weakness noted on L challenged eccentric control. HEP updated with hip and knee strengthening.   OBJECTIVE IMPAIRMENTS: decreased activity tolerance, decreased balance, difficulty walking, decreased ROM, decreased strength, and pain.    GOALS: Goals reviewed  with patient? Yes  SHORT TERM GOALS: Target date: 02/21/2023  Pt will be independent with initial HEP Baseline: Goal status: MET  2.  Pt will improve FOTO to >= 55 to demo improving functional mobility Baseline:  Goal status: IN PROGRESS 49 on 02/21/23  3.  Pt will improve Rt ankle DF ROM to 5 degrees to improve stair negotiation Baseline:  Goal status: IN PROGRESS   LONG TERM GOALS: Target date: 03/21/2023  Pt will be independent with advanced HEP Baseline:  Goal status: INITIAL  2.  Pt will improve FOTO to >= 63 to demo improved functional mobility Baseline:  Goal status: INITIAL  3.  Pt will improve LE strength to 4+/5 to improve standing and walking tolerance Baseline:  Goal status: INITIAL  4.  Pt will walk x 10 minutes with ankle and knee pain <= 2/10 Baseline: 8/10 Goal status: IN PROGRESS  5.  Pt will negotiate stairs with reciprocal pattern with pain <= 2/10 Baseline: step together Goal status: IN PROGRESS   PLAN:  PT FREQUENCY: 2x/week  PT DURATION: 8 weeks  PLANNED INTERVENTIONS: 97164- PT Re-evaluation, 97110-Therapeutic exercises, 97530- Therapeutic activity, [id number]- Neuromuscular re-education, 97535- Self Care, [id number]- Manual therapy, [id number]- Gait training, [phone]- Aquatic Therapy, 97014- Electrical stimulation (unattended), 97016- Vasopneumatic device, 97033- Ionotophoresis 4mg /ml Dexamethasone, Patient/Family education, Balance training, Stair  training, Taping, Dry Needling, Moist heat, and Biofeedback  PLAN FOR NEXT SESSION: continue with foot/ankle strengthening within pt tolerance, monitoring post session response and modifying accordingly.   Sims Duck, PTA 02/26/23, 4:20 PM

## 2023-02-28 ENCOUNTER — Ambulatory Visit: Payer: BC Managed Care – PPO | Admitting: Physical Therapy

## 2023-02-28 ENCOUNTER — Encounter: Payer: Self-pay | Admitting: Physical Therapy

## 2023-02-28 DIAGNOSIS — M25571 Pain in right ankle and joints of right foot: Secondary | ICD-10-CM | POA: Diagnosis not present

## 2023-02-28 DIAGNOSIS — R262 Difficulty in walking, not elsewhere classified: Secondary | ICD-10-CM

## 2023-02-28 DIAGNOSIS — G8929 Other chronic pain: Secondary | ICD-10-CM

## 2023-02-28 NOTE — Therapy (Signed)
OUTPATIENT PHYSICAL THERAPY LOWER EXTREMITY TREATMENT   Patient Name: Tracy [name] MRN: [id number] DOB:1965/08/21, 58 y.o., female Today's Date: 02/28/2023  END OF SESSION:   Past Medical History:  Diagnosis Date   Hypertension    Past Surgical History:  Procedure Laterality Date   TOE SURGERY     WISDOM TOOTH EXTRACTION     Patient Active Problem List   Diagnosis Date Noted   Achilles tendinitis, right leg 01/05/2023   Seasonal allergies 05/02/2021   Primary osteoarthritis of left hip 07/07/2020   Primary osteoarthritis of both knees 10/05/2015   Lumbar degenerative disc disease 10/05/2015   Left cervical radiculopathy 01/22/2015    PCP: Venia Minks  REFERRING PROVIDER: Benjamin Stain  REFERRING DIAG: Rt knee OA, Rt achilles tendinosis  THERAPY DIAG:  Pain in right ankle and joints of right foot  Chronic pain of both knees  Difficulty in walking, not elsewhere classified  Rationale for Evaluation and Treatment: Rehabilitation  ONSET DATE: October 2024  SUBJECTIVE:   SUBJECTIVE STATEMENT: Patient reports her pain is unchanged from last session.  EVAL: Pt states that in October she was carrying something heavy and her knee "popped" and she developed a baker's cyst in her Rt knee. After the knee injury her Rt foot began hurting and she finally saw Dr. Karie Schwalbe at the end of December who diagnosed her with Rt achilles tendonitis and prescribed a walking boot. She states she has a few days left to wear the boot. She states that her Lt knee began hurting about 2 weeks ago but that her Rt knee is feeling better. Rt ankle and Lt knee pain increase with stair negotiation, incline/decline, pain decrease with rest and meds.  PERTINENT HISTORY: History of knee pain, OA PAIN:  Are you having pain? Yes: NPRS scale: 6/10 in Rt foot, 2/10 Lt knee Pain location: Rt foot, Lt knee Pain description: sore, achey Aggravating factors: stairs, inclines Relieving factors: rest,  meds  PRECAUTIONS: None  RED FLAGS: None   WEIGHT BEARING RESTRICTIONS: No  FALLS:  Has patient fallen in last 6 months? 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  DIAGNOSTIC FINDINGS:  Rt ankle x ray: 1. Moderate size fragmented Achilles tendon enthesophyte with mild associated soft tissue thickening. 2. Moderate to large fragmented plantar calcaneal spur. 3. Mild subtalar osteoarthritis.  PATIENT SURVEYS:  FOTO 48   EDEMA:  +1 edema Lt knee   PALPATION: No TTP Rt ankle or Lt knee   LOWER EXTREMITY ROM:  Active ROM Right eval Left eval Right 2/5  Hip flexion     Hip extension     Hip abduction     Hip adduction     Hip internal rotation     Hip external rotation decreased decreased   Knee flexion 115 104   Knee extension     Ankle dorsiflexion 0 6 3  Ankle plantarflexion 45 55 48  Ankle inversion 10 25 20   Ankle eversion 25 31 20    (Blank rows = not tested)  LOWER EXTREMITY MMT:  MMT Right eval Left eval Right 02/21/23  Hip flexion 4- 4-   Hip extension     Hip abduction 4 4   Hip adduction     Hip internal rotation  Hip external rotation     Knee flexion 4- 4-   Knee extension 4 4   Ankle dorsiflexion 4 4   Ankle plantarflexion 4 4   Ankle inversion 3+ 4 4  Ankle eversion 3+ 4 4   (Blank rows = not tested)   GAIT: Distance walked: 100' Assistive device utilized: None Level of assistance: Complete Independence Comments: decreased cadence  [facility] Adult PT Treatment:                                                DATE: 02/28/23 Therapeutic Exercise: Ankle DF/PF on rocker board for stretch Eccentric heel raise x 10 Toe raise x 10 Calf stretch opp foot on step Side stepping yellow TB at counter HS curl no  resistance x 10 bilat Hip ext on a diagonal  x 10 bilat Seated ankle EVE/INV + GTB x15 each  Knee flexion/extension yellow TB x 15 each BAPS L2 CW/CCW Step down 4'' step x 5 bilat with UE support Manual Therapy: TC distraction  TC joint mobs   [facility] Adult PT Treatment:                                                DATE: 02/26/2023 Therapeutic Exercise: NuStep L5 x 5 min Ankle PROM --> passive DF stretches Seated ankle EVE/INV + GTB x15 each  Ankle DF/PF on rocker board for stretch Standing eccentric heel raises x10 Toe raises x10  (R) gastroc stretch on rocker board (opp foot on 4" step) Side stepping + YTB crossed at ankles (counter) HS curls (discontinued d/t hip discomfort/calf tightness) Bent over hip ext on diagonal Standing soleus stretch Seated resisted knee extension + YTB Manual Therapy: TC distraction  TC joint mobs     [facility] Adult PT Treatment:                                                DATE: 02/21/23 Therapeutic Exercise/Activity/NMR: Rocker board A/P for calf stretch x 1 min Rocker board A/P static hold for balance x 1 min - intermittent UE support Standing heel raise focus on eccentric control x 12 Standing toe raise x 15 Standing calf stretch 2 x 30 sec ROM measurements (see above) SLR x 12 neutral, x 12 IR, x 12 ER bilat Sidelying hip abd 2 x 10 bilat Education through handout and demo of self ankle distraction Manual Therapy: TC joint distraction Rt - good response  PATIENT EDUCATION:  Education details: rationale for interventions, HEP  Person educated: Patient Education method: Explanation, Demonstration, Tactile cues, Verbal cues Education comprehension: verbalized understanding, returned demonstration, verbal cues required, tactile cues required, and needs further education      HOME EXERCISE PROGRAM: Access Code:  [email or web address] URL: [email or web address] Date: 02/26/2023 Prepared by: Carlynn Herald  Program Notes - with ankle pumps, can use ball and pair with bending/straightening knee as done in clinic  Exercises - Gastroc Stretch on Wall  - 1 x daily - 7 x weekly - 1 sets - 3 reps - 20-30 seconds hold - Soleus Stretch on Wall  - 1 x daily - 7 x weekly - 1 sets - 3 reps - 20-30 seconds hold - Supine Hip External Rotation Stretch  - 1 x daily - 7 x weekly - 1 sets - 3 reps - 20-30 seconds hold - Ankle Inversion with Resistance  - 1 x daily - 7 x weekly - 3 sets - 10 reps - Ankle Eversion with Resistance  - 1 x daily - 7 x weekly - 3 sets - 10 reps - Towel Scrunches  - 1 x daily - 7 x weekly - 3 sets - 10 reps - Ankle Dorsiflexion with Resistance  - 1 x daily - 7 x weekly - 3 sets - 10 reps - Ankle and Toe Plantarflexion with Resistance  - 1 x daily - 7 x weekly - 3 sets - 10 reps - Sidelying Hip Abduction  - 1 x daily - 7 x weekly - 3 sets - 10 reps - Standing Heel Raises  - 1 x daily - 7 x weekly - 3 sets - 10 reps - Long Sitting Calf Stretch with Strap  - 3 x daily - 7 x weekly - 1 sets - 3 reps - 30 sec hold - Seated Ankle Pumps  - 2-3 x daily - 7 x weekly - 1 sets - 10 reps - Side Stepping with Resistance at Ankles  - 1 x daily - 7 x weekly - 3 sets - 10 reps - Seated Knee Extension with Resistance  - 1 x daily - 7 x weekly - 3 sets - 10 reps  ASSESSMENT:  CLINICAL IMPRESSION: Pt continues with increased pain with descending stairs. She is improving tolerance to standing exercises. PT educated pt on importance of regularly performing HEP to maximize benefits of PT.   OBJECTIVE IMPAIRMENTS: decreased activity tolerance, decreased balance, difficulty walking, decreased ROM, decreased strength, and pain.    GOALS: Goals reviewed with patient? Yes  SHORT TERM GOALS: Target date: 02/21/2023  Pt will be independent with initial HEP Baseline: Goal status: MET  2.  Pt will improve  FOTO to >= 55 to demo improving functional mobility Baseline:  Goal status: IN PROGRESS 49 on 02/21/23  3.  Pt will improve Rt ankle DF ROM to 5 degrees to improve stair negotiation Baseline:  Goal status: IN PROGRESS   LONG TERM GOALS: Target date: 03/21/2023  Pt will be independent with advanced HEP Baseline:  Goal status: INITIAL  2.  Pt will improve FOTO to >= 63 to demo improved functional mobility Baseline:  Goal status: INITIAL  3.  Pt will improve LE strength to 4+/5 to improve standing and walking tolerance Baseline:  Goal status: INITIAL  4.  Pt will walk x 10 minutes with ankle and knee pain <= 2/10 Baseline: 8/10 Goal status: IN PROGRESS  5.  Pt will negotiate stairs with reciprocal pattern with pain <=  2/10 Baseline: step together Goal status: IN PROGRESS   PLAN:  PT FREQUENCY: 2x/week  PT DURATION: 8 weeks  PLANNED INTERVENTIONS: 97164- PT Re-evaluation, 97110-Therapeutic exercises, 97530- Therapeutic activity, [id number]- Neuromuscular re-education, 97535- Self Care, 16109- Manual therapy, [id number]- Gait training, [phone]- Aquatic Therapy, 97014- Electrical stimulation (unattended), 97016- Vasopneumatic device, 97033- Ionotophoresis 4mg /ml Dexamethasone, Patient/Family education, Balance training, Stair training, Taping, Dry Needling, Moist heat, and Biofeedback  PLAN FOR NEXT SESSION: continue with foot/ankle strengthening within pt tolerance, monitoring post session response and modifying accordingly.   Reggy Eye, PT,DPT02/12/253:33 PM

## 2023-03-06 ENCOUNTER — Encounter: Payer: Self-pay | Admitting: Physical Therapy

## 2023-03-06 ENCOUNTER — Ambulatory Visit: Payer: BC Managed Care – PPO | Admitting: Physical Therapy

## 2023-03-06 DIAGNOSIS — R262 Difficulty in walking, not elsewhere classified: Secondary | ICD-10-CM

## 2023-03-06 DIAGNOSIS — M25571 Pain in right ankle and joints of right foot: Secondary | ICD-10-CM

## 2023-03-06 DIAGNOSIS — G8929 Other chronic pain: Secondary | ICD-10-CM

## 2023-03-06 NOTE — Therapy (Signed)
 OUTPATIENT PHYSICAL THERAPY LOWER EXTREMITY TREATMENT   Patient Name: Tracy [name] MRN: [id number] DOB:1965-10-08, 58 y.o., female Today's Date: 02/28/2023  END OF SESSION:   Past Medical History:  Diagnosis Date   Hypertension    Past Surgical History:  Procedure Laterality Date   TOE SURGERY     WISDOM TOOTH EXTRACTION     Patient Active Problem List   Diagnosis Date Noted   Achilles tendinitis, right leg 01/05/2023   Seasonal allergies 05/02/2021   Primary osteoarthritis of left hip 07/07/2020   Primary osteoarthritis of both knees 10/05/2015   Lumbar degenerative disc disease 10/05/2015   Left cervical radiculopathy 01/22/2015    PCP: Venia Minks  REFERRING PROVIDER: Benjamin Stain  REFERRING DIAG: Rt knee OA, Rt achilles tendinosis  THERAPY DIAG:  Pain in right ankle and joints of right foot  Chronic pain of both knees  Difficulty in walking, not elsewhere classified  Rationale for Evaluation and Treatment: Rehabilitation  ONSET DATE: October 2024  SUBJECTIVE:   SUBJECTIVE STATEMENT: Pt states her foot is "about the same". She had increased knee pain yesterday but it is feeling better today  EVAL: Pt states that in October she was carrying something heavy and her knee "popped" and she developed a baker's cyst in her Rt knee. After the knee injury her Rt foot began hurting and she finally saw Dr. Karie Schwalbe at the end of December who diagnosed her with Rt achilles tendonitis and prescribed a walking boot. She states she has a few days left to wear the boot. She states that her Lt knee began hurting about 2 weeks ago but that her Rt knee is feeling better. Rt ankle and Lt knee pain increase with stair negotiation, incline/decline, pain decrease with rest and meds.  PERTINENT HISTORY: History of knee pain, OA PAIN:  Are you having pain? Yes: NPRS scale: 6/10 in Rt foot, 2/10 Lt knee Pain location: Rt foot, Lt knee Pain description: sore, achey Aggravating factors:  stairs, inclines Relieving factors: rest, meds  PRECAUTIONS: None  RED FLAGS: None   WEIGHT BEARING RESTRICTIONS: No  FALLS:  Has patient fallen in last 6 months? 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  DIAGNOSTIC FINDINGS:  Rt ankle x ray: 1. Moderate size fragmented Achilles tendon enthesophyte with mild associated soft tissue thickening. 2. Moderate to large fragmented plantar calcaneal spur. 3. Mild subtalar osteoarthritis.  PATIENT SURVEYS:  FOTO 48   EDEMA:  +1 edema Lt knee   PALPATION: No TTP Rt ankle or Lt knee   LOWER EXTREMITY ROM:  Active ROM Right eval Left eval Right 2/5  Hip flexion     Hip extension     Hip abduction     Hip adduction     Hip internal rotation     Hip external rotation decreased decreased   Knee flexion 115 104   Knee extension     Ankle dorsiflexion 0 6 3  Ankle plantarflexion 45 55 48  Ankle inversion 10 25 20   Ankle eversion 25 31 20    (Blank rows = not tested)  LOWER EXTREMITY MMT:  MMT Right eval Left eval Right 02/21/23  Hip flexion 4- 4-   Hip extension     Hip abduction 4 4   Hip adduction  Hip internal rotation     Hip external rotation     Knee flexion 4- 4-   Knee extension 4 4   Ankle dorsiflexion 4 4   Ankle plantarflexion 4 4   Ankle inversion 3+ 4 4  Ankle eversion 3+ 4 4   (Blank rows = not tested)   GAIT: Distance walked: 100' Assistive device utilized: None Level of assistance: Complete Independence Comments: decreased cadence  [facility] Adult PT Treatment:                                                DATE: 03/06/23 Therapeutic Exercise/Activity/NMR: Rocker board A/P x 1 min, laterally x 1 min Green TB row tandem stance 2 x 10 Green TB pull down with  slow march x 15 Hip ext/diagonal yellow TB 2 x 10 bilat HS curl yellow TB 2 x 10 Heel raises toes in, toes out x 10 Calf stretch opp foot on step Seated ankle EVE/INV + GTB x 20 each  Supine: SLR, SLR + ER, SLR + IR, bridge x 10 Sidelying Hip abd 2 x 10   [facility] Adult PT Treatment:                                                DATE: 02/28/23 Therapeutic Exercise: Ankle DF/PF on rocker board for stretch Eccentric heel raise x 10 Toe raise x 10 Calf stretch opp foot on step Side stepping yellow TB at counter HS curl no resistance x 10 bilat Hip ext on a diagonal  x 10 bilat Seated ankle EVE/INV + GTB x15 each  Knee flexion/extension yellow TB x 15 each BAPS L2 CW/CCW Step down 4'' step x 5 bilat with UE support Manual Therapy: TC distraction  TC joint mobs   [facility] Adult PT Treatment:                                                DATE: 02/26/2023 Therapeutic Exercise: NuStep L5 x 5 min Ankle PROM --> passive DF stretches Seated ankle EVE/INV + GTB x15 each  Ankle DF/PF on rocker board for stretch Standing eccentric heel raises x10 Toe raises x10  (R) gastroc stretch on rocker board (opp foot on 4" step) Side stepping + YTB crossed at ankles (counter) HS curls (discontinued d/t hip discomfort/calf tightness) Bent over hip ext on diagonal Standing soleus stretch Seated resisted knee extension + YTB Manual Therapy: TC distraction  TC joint mobs  PATIENT EDUCATION:  Education details: rationale for interventions, HEP  Person educated: Patient Education method: Explanation, Demonstration, Tactile cues, Verbal cues Education comprehension: verbalized understanding, returned demonstration, verbal cues required, tactile cues required, and needs further education      HOME EXERCISE PROGRAM: Access Code: [email or web address] URL:  [email or web address] Date: 02/26/2023 Prepared by: Carlynn Herald  Program Notes - with ankle pumps, can use ball and pair with bending/straightening knee as done in clinic  Exercises - Gastroc Stretch on Wall  - 1 x daily - 7 x weekly - 1 sets - 3 reps - 20-30 seconds hold - Soleus Stretch on Wall  - 1 x daily - 7 x weekly - 1 sets - 3 reps - 20-30 seconds hold - Supine Hip External Rotation Stretch  - 1 x daily - 7 x weekly - 1 sets - 3 reps - 20-30 seconds hold - Ankle Inversion with Resistance  - 1 x daily - 7 x weekly - 3 sets - 10 reps - Ankle Eversion with Resistance  - 1 x daily - 7 x weekly - 3 sets - 10 reps - Towel Scrunches  - 1 x daily - 7 x weekly - 3 sets - 10 reps - Ankle Dorsiflexion with Resistance  - 1 x daily - 7 x weekly - 3 sets - 10 reps - Ankle and Toe Plantarflexion with Resistance  - 1 x daily - 7 x weekly - 3 sets - 10 reps - Sidelying Hip Abduction  - 1 x daily - 7 x weekly - 3 sets - 10 reps - Standing Heel Raises  - 1 x daily - 7 x weekly - 3 sets - 10 reps - Long Sitting Calf Stretch with Strap  - 3 x daily - 7 x weekly - 1 sets - 3 reps - 30 sec hold - Seated Ankle Pumps  - 2-3 x daily - 7 x weekly - 1 sets - 10 reps - Side Stepping with Resistance at Ankles  - 1 x daily - 7 x weekly - 3 sets - 10 reps - Seated Knee Extension with Resistance  - 1 x daily - 7 x weekly - 3 sets - 10 reps  ASSESSMENT:  CLINICAL IMPRESSION: Added more balance training to continue to challenge ankle and LE strength and stability. Pt with good tolerance - no increase in pain during session. She continues with pain that "comes and goes" and she is unable to state what increases or decreases pain. Also incorporated more core strengthening to improve general strength and conditioning.    OBJECTIVE IMPAIRMENTS: decreased activity tolerance, decreased balance, difficulty walking, decreased ROM, decreased strength, and pain.    GOALS: Goals reviewed with patient?  Yes  SHORT TERM GOALS: Target date: 02/21/2023  Pt will be independent with initial HEP Baseline: Goal status: MET  2.  Pt will improve FOTO to >= 55 to demo improving functional mobility Baseline:  Goal status: IN PROGRESS 49 on 02/21/23  3.  Pt will improve Rt ankle DF ROM to 5 degrees to improve stair negotiation Baseline:  Goal status: IN PROGRESS   LONG TERM GOALS: Target date: 03/21/2023  Pt will be independent with advanced HEP Baseline:  Goal status: INITIAL  2.  Pt will improve FOTO to >= 63 to demo improved functional mobility Baseline:  Goal status: INITIAL  3.  Pt will improve LE strength to 4+/5 to improve standing and walking tolerance Baseline:  Goal status: INITIAL  4.  Pt will walk x  10 minutes with ankle and knee pain <= 2/10 Baseline: 8/10 Goal status: IN PROGRESS  5.  Pt will negotiate stairs with reciprocal pattern with pain <= 2/10 Baseline: step together Goal status: IN PROGRESS   PLAN:  PT FREQUENCY: 2x/week  PT DURATION: 8 weeks  PLANNED INTERVENTIONS: 97164- PT Re-evaluation, 97110-Therapeutic exercises, 97530- Therapeutic activity, [id number]- Neuromuscular re-education, 97535- Self Care, 16109- Manual therapy, [id number]- Gait training, [phone]- Aquatic Therapy, 97014- Electrical stimulation (unattended), 97016- Vasopneumatic device, 97033- Ionotophoresis 4mg /ml Dexamethasone, Patient/Family education, Balance training, Stair training, Taping, Dry Needling, Moist heat, and Biofeedback  PLAN FOR NEXT SESSION: UPDATE HEP continue with foot/ankle strengthening within pt tolerance, monitoring post session response and modifying accordingly.   Reggy Eye, PT,DPT02/12/253:33 PM

## 2023-03-08 ENCOUNTER — Ambulatory Visit: Payer: BC Managed Care – PPO

## 2023-03-13 ENCOUNTER — Encounter: Payer: Self-pay | Admitting: Physical Therapy

## 2023-03-13 ENCOUNTER — Ambulatory Visit: Payer: BC Managed Care – PPO | Admitting: Physical Therapy

## 2023-03-13 DIAGNOSIS — M25571 Pain in right ankle and joints of right foot: Secondary | ICD-10-CM | POA: Diagnosis not present

## 2023-03-13 DIAGNOSIS — G8929 Other chronic pain: Secondary | ICD-10-CM

## 2023-03-13 DIAGNOSIS — R262 Difficulty in walking, not elsewhere classified: Secondary | ICD-10-CM

## 2023-03-13 NOTE — Therapy (Addendum)
 OUTPATIENT PHYSICAL THERAPY LOWER EXTREMITY TREATMENT AND DISCHARGE   Patient Name: Tracy [name] MRN: [id number] DOB:10-09-1965, 58 y.o., female Today's Date: 02/28/2023  END OF SESSION:  PT End of Session - 03/13/23 1614     Visit Number 13    Number of Visits 16    Date for PT Re-Evaluation 03/21/23    Authorization Type [employer]    PT Start Time 1615    PT Stop Time 1656    PT Time Calculation (min) 41 min    Activity Tolerance Patient tolerated treatment well               Past Medical History:  Diagnosis Date   Hypertension    Past Surgical History:  Procedure Laterality Date   TOE SURGERY     WISDOM TOOTH EXTRACTION     Patient Active Problem List   Diagnosis Date Noted   Achilles tendinitis, right leg 01/05/2023   Seasonal allergies 05/02/2021   Primary osteoarthritis of left hip 07/07/2020   Primary osteoarthritis of both knees 10/05/2015   Lumbar degenerative disc disease 10/05/2015   Left cervical radiculopathy 01/22/2015    PCP: Venia Minks  REFERRING PROVIDER: Benjamin Stain  REFERRING DIAG: Rt knee OA, Rt achilles tendinosis  THERAPY DIAG:  Pain in right ankle and joints of right foot  Chronic pain of both knees  Difficulty in walking, not elsewhere classified  Rationale for Evaluation and Treatment: Rehabilitation  ONSET DATE: October 2024  SUBJECTIVE:   SUBJECTIVE STATEMENT: 03/13/2023 Pt states that last few days foot has been feeling better while walking, knee pain comes and goes. 2/10 foot pain while walking, no resting pain. Felt good after last session.    EVAL: Pt states that in October she was carrying something heavy and her knee "popped" and she developed a baker's cyst in her Rt knee. After the knee injury her Rt foot began hurting and she finally saw Dr. Karie Schwalbe at the end of December who diagnosed her with Rt achilles tendonitis and prescribed a walking boot. She states she has a few days left to wear the boot. She states that her  Lt knee began hurting about 2 weeks ago but that her Rt knee is feeling better. Rt ankle and Lt knee pain increase with stair negotiation, incline/decline, pain decrease with rest and meds.  PERTINENT HISTORY: History of knee pain, OA PAIN:  Are you having pain? Yes: NPRS scale: 2/10 pain in foot while walking, no pain in knee Pain location: Rt foot, Lt knee Pain description: sore, achey Aggravating factors: stairs, inclines Relieving factors: rest, meds  PRECAUTIONS: None  RED FLAGS: None   WEIGHT BEARING RESTRICTIONS: No  FALLS:  Has patient fallen in last 6 months? 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  DIAGNOSTIC FINDINGS:  Rt ankle x ray: 1. Moderate size fragmented Achilles tendon enthesophyte with mild associated soft tissue thickening. 2. Moderate to large fragmented plantar calcaneal spur. 3. Mild subtalar osteoarthritis.  PATIENT SURVEYS:  FOTO 48   EDEMA:  +1 edema Lt knee   PALPATION: No TTP Rt ankle or Lt knee   LOWER EXTREMITY ROM:  Active ROM Right eval Left eval Right 2/5  Hip flexion     Hip extension     Hip abduction  Hip adduction     Hip internal rotation     Hip external rotation decreased decreased   Knee flexion 115 104   Knee extension     Ankle dorsiflexion 0 6 3  Ankle plantarflexion 45 55 48  Ankle inversion 10 25 20   Ankle eversion 25 31 20    (Blank rows = not tested)  LOWER EXTREMITY MMT:  MMT Right eval Left eval Right 02/21/23  Hip flexion 4- 4-   Hip extension     Hip abduction 4 4   Hip adduction     Hip internal rotation     Hip external rotation     Knee flexion 4- 4-   Knee extension 4 4   Ankle dorsiflexion 4 4   Ankle plantarflexion 4 4   Ankle inversion 3+ 4 4   Ankle eversion 3+ 4 4   (Blank rows = not tested)   GAIT: Distance walked: 100' Assistive device utilized: None Level of assistance: Complete Independence Comments: decreased cadence   [facility] Adult PT Treatment:                                                DATE: 03/13/23 Therapeutic Exercise: Sidelying hip abduction (neutral, fwd, back) x8 BIL AP rockerboard 2x17min, medial/lateral x18min  Manual Therapy: Long sitting; grade 2-3 TC distraction, grade 2-3 AP TC mobs paired with passive DF   Neuromuscular re-ed: Fwd step up onto dynadisc 2x5 BIL w/ UE support  Bosu TKE + PF 2x10 BIL  Heel raise w/ ball for post tib activation off of 2 inch step, 2x8   [facility] Adult PT Treatment:                                                DATE: 03/06/23 Therapeutic Exercise/Activity/NMR: Rocker board A/P x 1 min, laterally x 1 min Green TB row tandem stance 2 x 10 Green TB pull down with slow march x 15 Hip ext/diagonal yellow TB 2 x 10 bilat HS curl yellow TB 2 x 10 Heel raises toes in, toes out x 10 Calf stretch opp foot on step Seated ankle EVE/INV + GTB x 20 each  Supine: SLR, SLR + ER, SLR + IR, bridge x 10 Sidelying Hip abd 2 x 10   [facility] Adult PT Treatment:                                                DATE: 02/28/23 Therapeutic Exercise: Ankle DF/PF on rocker board for stretch Eccentric heel raise x 10 Toe raise x 10 Calf stretch opp foot on step Side stepping yellow TB at counter HS curl no resistance x 10 bilat Hip ext on a diagonal  x 10 bilat Seated ankle EVE/INV + GTB x15 each  Knee flexion/extension yellow TB x 15 each BAPS L2 CW/CCW Step down 4'' step x 5 bilat with UE support Manual Therapy: TC distraction  TC joint mobs   [facility] Adult PT Treatment:  DATE: 02/26/2023 Therapeutic Exercise: NuStep L5 x 5 min Ankle PROM --> passive DF stretches Seated ankle EVE/INV + GTB x15 each  Ankle DF/PF on rocker board for  stretch Standing eccentric heel raises x10 Toe raises x10  (R) gastroc stretch on rocker board (opp foot on 4" step) Side stepping + YTB crossed at ankles (counter) HS curls (discontinued d/t hip discomfort/calf tightness) Bent over hip ext on diagonal Standing soleus stretch Seated resisted knee extension + YTB Manual Therapy: TC distraction  TC joint mobs                                                                                                                              PATIENT EDUCATION:  Education details: rationale for interventions, HEP  Person educated: Patient Education method: Explanation, Demonstration, Tactile cues, Verbal cues Education comprehension: verbalized understanding, returned demonstration, verbal cues required, tactile cues required, and needs further education      HOME EXERCISE PROGRAM: Access Code: [email or web address] URL: [email or web address] Date: 02/26/2023 Prepared by: Carlynn Herald  Program Notes - with ankle pumps, can use ball and pair with bending/straightening knee as done in clinic  Exercises - Gastroc Stretch on Wall  - 1 x daily - 7 x weekly - 1 sets - 3 reps - 20-30 seconds hold - Soleus Stretch on Wall  - 1 x daily - 7 x weekly - 1 sets - 3 reps - 20-30 seconds hold - Supine Hip External Rotation Stretch  - 1 x daily - 7 x weekly - 1 sets - 3 reps - 20-30 seconds hold - Ankle Inversion with Resistance  - 1 x daily - 7 x weekly - 3 sets - 10 reps - Ankle Eversion with Resistance  - 1 x daily - 7 x weekly - 3 sets - 10 reps - Towel Scrunches  - 1 x daily - 7 x weekly - 3 sets - 10 reps - Ankle Dorsiflexion with Resistance  - 1 x daily - 7 x weekly - 3 sets - 10 reps - Ankle and Toe Plantarflexion with Resistance  - 1 x daily - 7 x weekly - 3 sets - 10 reps - Sidelying Hip Abduction  - 1 x daily - 7 x weekly - 3 sets - 10 reps - Standing Heel Raises  - 1 x daily - 7 x weekly - 3 sets - 10 reps - Long Sitting Calf Stretch with  Strap  - 3 x daily - 7 x weekly - 1 sets - 3 reps - 30 sec hold - Seated Ankle Pumps  - 2-3 x daily - 7 x weekly - 1 sets - 10 reps - Side Stepping with Resistance at Ankles  - 1 x daily - 7 x weekly - 3 sets - 10 reps - Seated Knee Extension with Resistance  - 1 x daily - 7 x weekly - 3 sets - 10 reps  ASSESSMENT:  CLINICAL IMPRESSION: 03/13/2023 Pt arrives w/  report of no resting pain, 2/10 while walking. Today pt is able to progress for increased hip endurance, increased volume with ankle/knee stability training. Tolerates well overall with report of gradually improving pain as session goes on, although does return to baseline levels w/ post tib heel raises. No adverse events. Pt departs today's session in no acute distress, all voiced questions/concerns addressed appropriately from PT perspective.      OBJECTIVE IMPAIRMENTS: decreased activity tolerance, decreased balance, difficulty walking, decreased ROM, decreased strength, and pain.    GOALS: Goals reviewed with patient? Yes  SHORT TERM GOALS: Target date: 02/21/2023  Pt will be independent with initial HEP Baseline: Goal status: MET  2.  Pt will improve FOTO to >= 55 to demo improving functional mobility Baseline:  Goal status: IN PROGRESS 49 on 02/21/23  3.  Pt will improve Rt ankle DF ROM to 5 degrees to improve stair negotiation Baseline:  Goal status: IN PROGRESS   LONG TERM GOALS: Target date: 03/21/2023  Pt will be independent with advanced HEP Baseline:  Goal status: INITIAL  2.  Pt will improve FOTO to >= 63 to demo improved functional mobility Baseline:  Goal status: INITIAL  3.  Pt will improve LE strength to 4+/5 to improve standing and walking tolerance Baseline:  Goal status: INITIAL  4.  Pt will walk x 10 minutes with ankle and knee pain <= 2/10 Baseline: 8/10 Goal status: IN PROGRESS  5.  Pt will negotiate stairs with reciprocal pattern with pain <= 2/10 Baseline: step together Goal status: IN  PROGRESS   PLAN:  PT FREQUENCY: 2x/week  PT DURATION: 8 weeks  PLANNED INTERVENTIONS: 97164- PT Re-evaluation, 97110-Therapeutic exercises, 97530- Therapeutic activity, 97112- Neuromuscular re-education, 97535- Self Care, 16109- Manual therapy, [id number]- Gait training, [phone]- Aquatic Therapy, 97014- Electrical stimulation (unattended), 97016- Vasopneumatic device, [id number]- Ionotophoresis 4mg /ml Dexamethasone, Patient/Family education, Balance training, Stair training, Taping, Dry Needling, Moist heat, and Biofeedback  PLAN FOR NEXT SESSION:  continue with foot/ankle strengthening within pt tolerance, monitoring post session response and modifying accordingly.    Ashley Murrain PT, DPT 03/13/2023 5:01 PM   PHYSICAL THERAPY DISCHARGE SUMMARY  Visits from Start of Care: 13  Current functional level related to goals / functional outcomes: Improving activity tolerance and strength   Remaining deficits: See above   Education / Equipment: HEP   Patient agrees to discharge. Patient goals were partially met. Patient is being discharged due to not returning since the last visit. Reggy Eye, PT,DPT03/25/253:19 PM

## 2023-03-15 ENCOUNTER — Encounter: Payer: BC Managed Care – PPO | Admitting: Physical Therapy

## 2023-03-16 ENCOUNTER — Ambulatory Visit: Payer: BC Managed Care – PPO | Admitting: Physical Therapy

## 2023-03-30 ENCOUNTER — Ambulatory Visit: Payer: BC Managed Care – PPO | Admitting: Sports Medicine

## 2023-09-18 ENCOUNTER — Encounter: Payer: Self-pay | Admitting: Sports Medicine
